# Patient Record
Sex: Male | Born: 1980 | Race: White | Hispanic: No | Smoking: Current every day smoker
Health system: Southern US, Community
[De-identification: ages and names within clinical notes are randomized; demographics above are authoritative.]

## PROBLEM LIST (undated history)

## (undated) DIAGNOSIS — Z720 Tobacco use: Secondary | ICD-10-CM

## (undated) DIAGNOSIS — R112 Nausea with vomiting, unspecified: Secondary | ICD-10-CM

## (undated) DIAGNOSIS — Z9889 Other specified postprocedural states: Secondary | ICD-10-CM

## (undated) DIAGNOSIS — T7840XA Allergy, unspecified, initial encounter: Secondary | ICD-10-CM

## (undated) DIAGNOSIS — IMO0001 Reserved for inherently not codable concepts without codable children: Secondary | ICD-10-CM

## (undated) DIAGNOSIS — K219 Gastro-esophageal reflux disease without esophagitis: Secondary | ICD-10-CM

## (undated) DIAGNOSIS — K604 Rectal fistula, unspecified: Secondary | ICD-10-CM

## (undated) HISTORY — DX: Tobacco use: Z72.0

## (undated) HISTORY — PX: MOUTH SURGERY: SHX715

## (undated) HISTORY — DX: Rectal fistula, unspecified: K60.40

## (undated) HISTORY — DX: Allergy, unspecified, initial encounter: T78.40XA

## (undated) HISTORY — DX: Rectal fistula: K60.4

## (undated) HISTORY — PX: KNEE ARTHROSCOPY: SUR90

---

## 2011-08-11 ENCOUNTER — Other Ambulatory Visit: Payer: Self-pay | Admitting: Neurological Surgery

## 2011-08-11 DIAGNOSIS — M545 Low back pain, unspecified: Secondary | ICD-10-CM

## 2011-08-12 ENCOUNTER — Inpatient Hospital Stay: Admission: RE | Admit: 2011-08-12 | Payer: Self-pay | Source: Ambulatory Visit

## 2011-08-16 ENCOUNTER — Ambulatory Visit
Admission: RE | Admit: 2011-08-16 | Discharge: 2011-08-16 | Disposition: A | Discharge status: Home / Self Care | Payer: BC Managed Care – PPO | Source: Ambulatory Visit | Attending: Neurological Surgery | Admitting: Neurological Surgery

## 2011-08-16 DIAGNOSIS — M545 Low back pain, unspecified: Secondary | ICD-10-CM

## 2014-09-15 ENCOUNTER — Ambulatory Visit: Payer: Self-pay | Admitting: Family

## 2015-09-21 ENCOUNTER — Emergency Department (HOSPITAL_BASED_OUTPATIENT_CLINIC_OR_DEPARTMENT_OTHER): Payer: BLUE CROSS/BLUE SHIELD

## 2015-09-21 ENCOUNTER — Encounter (HOSPITAL_BASED_OUTPATIENT_CLINIC_OR_DEPARTMENT_OTHER): Payer: Self-pay | Admitting: *Deleted

## 2015-09-21 ENCOUNTER — Observation Stay (HOSPITAL_BASED_OUTPATIENT_CLINIC_OR_DEPARTMENT_OTHER)
Admission: EM | Admit: 2015-09-21 | Discharge: 2015-09-23 | Disposition: A | Discharge status: Home / Self Care | Payer: BLUE CROSS/BLUE SHIELD | Attending: General Surgery | Admitting: General Surgery

## 2015-09-21 DIAGNOSIS — F17210 Nicotine dependence, cigarettes, uncomplicated: Secondary | ICD-10-CM | POA: Insufficient documentation

## 2015-09-21 DIAGNOSIS — K219 Gastro-esophageal reflux disease without esophagitis: Secondary | ICD-10-CM | POA: Diagnosis not present

## 2015-09-21 DIAGNOSIS — R509 Fever, unspecified: Secondary | ICD-10-CM | POA: Diagnosis present

## 2015-09-21 DIAGNOSIS — K611 Rectal abscess: Principal | ICD-10-CM | POA: Insufficient documentation

## 2015-09-21 DIAGNOSIS — L02215 Cutaneous abscess of perineum: Secondary | ICD-10-CM

## 2015-09-21 HISTORY — DX: Gastro-esophageal reflux disease without esophagitis: K21.9

## 2015-09-21 LAB — CBC
HCT: 41.2 % (ref 39.0–52.0)
Hemoglobin: 14.3 g/dL (ref 13.0–17.0)
MCH: 29.4 pg (ref 26.0–34.0)
MCHC: 34.7 g/dL (ref 30.0–36.0)
MCV: 84.6 fL (ref 78.0–100.0)
Platelets: 231 10*3/uL (ref 150–400)
RBC: 4.87 MIL/uL (ref 4.22–5.81)
RDW: 13.1 % (ref 11.5–15.5)
WBC: 12.5 10*3/uL — ABNORMAL HIGH (ref 4.0–10.5)

## 2015-09-21 LAB — COMPREHENSIVE METABOLIC PANEL
ALT: 24 U/L (ref 17–63)
AST: 18 U/L (ref 15–41)
Albumin: 4.4 g/dL (ref 3.5–5.0)
Alkaline Phosphatase: 62 U/L (ref 38–126)
Anion gap: 9 (ref 5–15)
BUN: 14 mg/dL (ref 6–20)
CO2: 26 mmol/L (ref 22–32)
Calcium: 9 mg/dL (ref 8.9–10.3)
Chloride: 104 mmol/L (ref 101–111)
Creatinine, Ser: 1.01 mg/dL (ref 0.61–1.24)
GFR calc Af Amer: >60 mL/min (ref >60)
GFR calc non Af Amer: >60 mL/min (ref >60)
Glucose, Bld: 154 mg/dL — ABNORMAL HIGH (ref 65–99)
Potassium: 3.6 mmol/L (ref 3.5–5.1)
Sodium: 139 mmol/L (ref 135–145)
Total Bilirubin: 0.9 mg/dL (ref 0.3–1.2)
Total Protein: 7.4 g/dL (ref 6.5–8.1)

## 2015-09-21 MED ORDER — ACETAMINOPHEN 500 MG PO TABS
1000.0000 mg | ORAL_TABLET | Freq: Once | ORAL | Status: AC
  Administered 2015-09-21: 1000 mg via ORAL

## 2015-09-21 MED ORDER — KETOROLAC TROMETHAMINE 30 MG/ML IJ SOLN
30.0000 mg | Freq: Once | INTRAMUSCULAR | Status: AC
Start: 2015-09-21 — End: 2015-09-21
  Administered 2015-09-21: 30 mg via INTRAVENOUS
  Filled 2015-09-21: qty 1

## 2015-09-21 MED ORDER — ACETAMINOPHEN 500 MG PO TABS
ORAL_TABLET | ORAL | Status: AC
  Filled 2015-09-21: qty 2

## 2015-09-21 MED ORDER — SODIUM CHLORIDE 0.9 % IV BOLUS (SEPSIS)
1000.0000 mL | Freq: Once | INTRAVENOUS | Status: AC
  Administered 2015-09-21: 1000 mL via INTRAVENOUS

## 2015-09-21 NOTE — ED Notes (Signed)
Pt c/o abscess noted above rectal area, also c/o fatigue and generalized weakness and  fever  X 1 week

## 2015-09-21 NOTE — ED Provider Notes (Signed)
CSN: [id number]     Arrival date & time 09/21/15  2215 History  By signing my name below, I, Freida Busman, attest that this documentation has been prepared under the direction and in the presence of Cyan Moultrie, MD . Electronically Signed: Freida Busman, Scribe. 09/21/2015. 11:45 PM.  Chief Complaint  Patient presents with  . Abscess  . Fever   Patient is a 35 y.o. male presenting with fever and constipation. The history is provided by the patient. No language interpreter was used.  Fever Temp source:  Oral Severity:  Moderate Onset quality:  Gradual Timing:  Intermittent Progression:  Unchanged Chronicity:  New Relieved by:  Nothing Worsened by:  Nothing tried Associated symptoms: diarrhea and nausea   Associated symptoms: no chest pain, no dysuria and no vomiting   Associated symptoms comment:  Bulging area by his bottom Risk factors: no hx of cancer and no immunosuppression   Constipation Severity:  Moderate Progression:  Resolved Chronicity:  New Relieved by:  Stool softeners Associated symptoms: diarrhea, fever and nausea   Associated symptoms: no dysuria and no vomiting      HPI Comments:  YUAN GANN is a 35 y.o. male who presents to the Emergency Department complaining of painful area to his rectum which he first noticed yesterday. He is unsure if it is hemorrhoid or abscess. He  notes associated constipation x ~ 5 days with associated nausea x 3 days and fever. He reports taking stool softeners and having multiple episodes of diarrhea 3 days ago. No alleviating factors noted for pain. He also notes he has small children at home.    Past Medical History  Diagnosis Date  . GERD (gastroesophageal reflux disease)    History reviewed. No pertinent past surgical history. History reviewed. No pertinent family history. Social History  Substance Use Topics  . Smoking status: Current Every Day Smoker -- 1.00 packs/day    Types: Cigarettes  . Smokeless tobacco: None  .  Alcohol Use: No    Review of Systems  Constitutional: Positive for fever.  Respiratory: Negative for shortness of breath.   Cardiovascular: Negative for chest pain.  Gastrointestinal: Positive for nausea, diarrhea and constipation. Negative for vomiting.  Genitourinary: Negative for dysuria.  All other systems reviewed and are negative.  Allergies  Review of patient's allergies indicates no known allergies.  Home Medications   Prior to Admission medications   Medication Sig Start Date End Date Taking? Authorizing Provider  ibuprofen (ADVIL,MOTRIN) 400 MG tablet Take 400 mg by mouth every 6 (six) hours as needed.   Yes Historical Provider, MD  omeprazole (PRILOSEC) 40 MG capsule Take 40 mg by mouth daily.   Yes Historical Provider, MD   BP 126/64 mmHg  Pulse 89  Temp(Src) 100.5 F (38.1 C)  Resp 16  Ht  (2.032 m)  Wt 215 lb (97.523 kg)  BMI 23.62 kg/m2  SpO2 98% Physical Exam  Constitutional: He is oriented to person, place, and time. He appears well-developed and well-nourished. No distress.  HENT:  Head: Normocephalic and atraumatic.  Mouth/Throat: Oropharynx is clear and moist. No oropharyngeal exudate.  Moist mucous membranes   Eyes: Conjunctivae are normal. Pupils are equal, round, and reactive to light.  Neck: No JVD present.  Trachea midline  Cardiovascular: Normal rate, regular rhythm and normal heart sounds.   Pulmonary/Chest: Effort normal and breath sounds normal. No respiratory distress. He has no wheezes. He has no rales.  Abdominal: Soft. Bowel sounds are normal. He exhibits no  distension and no mass. There is no tenderness. There is no rebound and no guarding.  Genitourinary:  induration and warmth in perineum; no definitive fluctuance.  No erythema Chaperone (scribe) was present for rectal exam which was performed with no discomfort or complications.   Musculoskeletal: Normal range of motion.  Lymphadenopathy:    He has no cervical adenopathy.   Neurological: He is alert and oriented to person, place, and time. He has normal reflexes.  Skin: Skin is warm and dry.  Psychiatric: He has a normal mood and affect. His behavior is normal.  Nursing note and vitals reviewed.   ED Course  Procedures  DIAGNOSTIC STUDIES:  Oxygen Saturation is 98% on RA, normal by my interpretation.    COORDINATION OF CARE:  11:42 PM Discussed treatment plan with pt at bedside and pt agreed to plan.  Labs Review Labs Reviewed  CBC - Abnormal; Notable for the following:    WBC 12.5 (*)    All other components within normal limits  COMPREHENSIVE METABOLIC PANEL - Abnormal; Notable for the following:    Glucose, Bld 154 (*)    All other components within normal limits  CULTURE, BLOOD (SINGLE)    Imaging Review No results found. I have personally reviewed and evaluated these images and lab results as part of my medical decision-making.   EKG Interpretation None      MDM   Final diagnoses:  None   Filed Vitals:   09/22/15 0230 09/22/15 0300  BP: 131/77 111/87  Pulse: 75 85  Temp: 98.1 F (36.7 C)   Resp: 16    Results for orders placed or performed during the hospital encounter of 09/21/15  CBC  Result Value Ref Range   WBC 12.5 (H) 4.0 - 10.5 K/uL   RBC 4.87 4.22 - 5.81 MIL/uL   Hemoglobin 14.3 13.0 - 17.0 g/dL   HCT 16.1 09.6 - 04.5 %   MCV 84.6 78.0 - 100.0 fL   MCH 29.4 26.0 - 34.0 pg   MCHC 34.7 30.0 - 36.0 g/dL   RDW 40.9 81.1 - 91.4 %   Platelets 231 150 - 400 K/uL  Comprehensive metabolic panel  Result Value Ref Range   Sodium 139 135 - 145 mmol/L   Potassium 3.6 3.5 - 5.1 mmol/L   Chloride 104 101 - 111 mmol/L   CO2 26 22 - 32 mmol/L   Glucose, Bld 154 (H) 65 - 99 mg/dL   BUN 14 6 - 20 mg/dL   Creatinine, Ser 7.82 0.61 - 1.24 mg/dL   Calcium 9.0 8.9 - 95.6 mg/dL   Total Protein 7.4 6.5 - 8.1 g/dL   Albumin 4.4 3.5 - 5.0 g/dL   AST 18 15 - 41 U/L   ALT 24 17 - 63 U/L   Alkaline Phosphatase 62 38 - 126 U/L    Total Bilirubin 0.9 0.3 - 1.2 mg/dL   GFR calc non Af Amer >60 >60 mL/min   GFR calc Af Amer >60 >60 mL/min   Anion gap 9 5 - 15   Ct Abdomen Pelvis W Contrast  09/22/2015  CLINICAL DATA:  Peroneal pain for 2 days. EXAM: CT ABDOMEN AND PELVIS WITH CONTRAST TECHNIQUE: Multidetector CT imaging of the abdomen and pelvis was performed using the standard protocol following bolus administration of intravenous contrast. CONTRAST:  ISOVUE-300 IOPAMIDOL (ISOVUE-300) INJECTION 61% COMPARISON:  None. FINDINGS: There is a peripherally enhancing perineal collection measuring 2.6 x 4.0 by 3.0 cm. This probably represents peroneal abscess.  There is no soft tissue gas. There is no extension into the pelvis or ischiorectal fossa. There are normal appearances of the liver, gallbladder, bile ducts, pancreas, spleen, adrenals and kidneys. The abdominal aorta is normal in caliber. There is no atherosclerotic calcification. There is no adenopathy in the abdomen or pelvis. There are normal appearances of the stomach, small bowel and colon. The appendix is normal. There is no significant abnormality in the lower chest. There is no significant skeletal lesion. Moderate degenerative lumbar disc disease is present with osteophytic encroachment on the left neural foramen at L5-S1. IMPRESSION: 1. 3 x 4 cm peripherally enhancing perineal collection consistent with abscess. 2. No other acute findings are evident. Electronically Signed   By: Ellery Plunkaniel R Mitchell M.D.   On: 09/22/2015 01:12    Medications  acetaminophen (TYLENOL) tablet 1,000 mg (1,000 mg Oral Given 09/21/15 2230)  sodium chloride 0.9 % bolus 1,000 mL (0 mLs Intravenous Stopped 09/22/15 0044)  ketorolac (TORADOL) 30 MG/ML injection 30 mg (30 mg Intravenous Given 09/21/15 2350)  iopamidol (ISOVUE-300) 61 % injection 100 mL (100 mLs Intravenous Contrast Given 09/22/15 0054)  vancomycin (VANCOCIN) IVPB 1000 mg/200 mL premix (0 mg Intravenous Stopped 09/22/15 0521)   piperacillin-tazobactam (ZOSYN) IVPB 3.375 g (0 g Intravenous Stopped 09/22/15 0308)  sodium chloride 0.9 % bolus 1,000 mL (0 mLs Intravenous Stopped 09/22/15 0522)  fentaNYL (SUBLIMAZE) injection 100 mcg (100 mcg Intravenous Given 09/22/15 0238)    Case d/w Dr. Daphine DeutscherMartin IV abx [facility] Case d/w Dr. Judd Lienelo who is aware  I personally performed the services described in this documentation, which was scribed in my presence. The recorded information has been reviewed and is accurate.      Cy BlamerApril Amanii Snethen, MD 09/22/15 [phone]

## 2015-09-22 ENCOUNTER — Encounter (HOSPITAL_COMMUNITY)
Admission: EM | Disposition: A | Discharge status: Home / Self Care | Payer: Self-pay | Source: Home / Self Care | Attending: Emergency Medicine

## 2015-09-22 ENCOUNTER — Encounter (HOSPITAL_BASED_OUTPATIENT_CLINIC_OR_DEPARTMENT_OTHER): Payer: Self-pay | Admitting: Emergency Medicine

## 2015-09-22 ENCOUNTER — Observation Stay (HOSPITAL_COMMUNITY): Payer: BLUE CROSS/BLUE SHIELD | Admitting: Certified Registered Nurse Anesthetist

## 2015-09-22 DIAGNOSIS — K611 Rectal abscess: Secondary | ICD-10-CM | POA: Diagnosis not present

## 2015-09-22 DIAGNOSIS — F17210 Nicotine dependence, cigarettes, uncomplicated: Secondary | ICD-10-CM | POA: Diagnosis not present

## 2015-09-22 DIAGNOSIS — K219 Gastro-esophageal reflux disease without esophagitis: Secondary | ICD-10-CM | POA: Diagnosis not present

## 2015-09-22 HISTORY — PX: IRRIGATION AND DEBRIDEMENT ABSCESS: SHX5252

## 2015-09-22 LAB — CREATININE, SERUM
Creatinine, Ser: 0.96 mg/dL (ref 0.61–1.24)
GFR calc Af Amer: >60 mL/min (ref >60)
GFR calc non Af Amer: >60 mL/min (ref >60)

## 2015-09-22 LAB — CBC
HCT: 39.5 % (ref 39.0–52.0)
Hemoglobin: 13.5 g/dL (ref 13.0–17.0)
MCH: 28.8 pg (ref 26.0–34.0)
MCHC: 34.2 g/dL (ref 30.0–36.0)
MCV: 84.2 fL (ref 78.0–100.0)
Platelets: 211 10*3/uL (ref 150–400)
RBC: 4.69 MIL/uL (ref 4.22–5.81)
RDW: 13.5 % (ref 11.5–15.5)
WBC: 12.4 10*3/uL — ABNORMAL HIGH (ref 4.0–10.5)

## 2015-09-22 LAB — SURGICAL PCR SCREEN
MRSA, PCR: NEGATIVE
Staphylococcus aureus: NEGATIVE

## 2015-09-22 LAB — GRAM STAIN

## 2015-09-22 SURGERY — IRRIGATION AND DEBRIDEMENT ABSCESS
Anesthesia: General | Site: Perineum

## 2015-09-22 MED ORDER — ONDANSETRON HCL 4 MG/2ML IJ SOLN
INTRAMUSCULAR | Status: DC | PRN
  Administered 2015-09-22: 4 mg via INTRAVENOUS

## 2015-09-22 MED ORDER — PIPERACILLIN-TAZOBACTAM 3.375 G IVPB 30 MIN
3.3750 g | Freq: Once | INTRAVENOUS | Status: AC
  Administered 2015-09-22: 3.375 g via INTRAVENOUS
  Filled 2015-09-22 (×2): qty 50

## 2015-09-22 MED ORDER — PIPERACILLIN-TAZOBACTAM 3.375 G IVPB
3.3750 g | Freq: Three times a day (TID) | INTRAVENOUS | Status: DC
  Administered 2015-09-22 – 2015-09-23 (×3): 3.375 g via INTRAVENOUS
  Filled 2015-09-22 (×5): qty 50

## 2015-09-22 MED ORDER — KCL IN DEXTROSE-NACL 20-5-0.45 MEQ/L-%-% IV SOLN
INTRAVENOUS | Status: DC
  Administered 2015-09-22: 10:00:00 via INTRAVENOUS
  Filled 2015-09-22 (×2): qty 1000

## 2015-09-22 MED ORDER — ONDANSETRON 4 MG PO TBDP: 4.0000 mg | ORAL_TABLET | Freq: Four times a day (QID) | ORAL | Status: DC | PRN

## 2015-09-22 MED ORDER — BUPIVACAINE LIPOSOME 1.3 % IJ SUSP
20.0000 mL | Freq: Once | INTRAMUSCULAR | Status: DC
  Filled 2015-09-22: qty 20

## 2015-09-22 MED ORDER — BUPIVACAINE HCL (PF) 0.5 % IJ SOLN
INTRAMUSCULAR | Status: DC | PRN
  Administered 2015-09-22: 10 mL

## 2015-09-22 MED ORDER — MIDAZOLAM HCL 5 MG/5ML IJ SOLN
INTRAMUSCULAR | Status: DC | PRN
  Administered 2015-09-22 (×2): 1 mg via INTRAVENOUS

## 2015-09-22 MED ORDER — MORPHINE SULFATE (PF) 2 MG/ML IV SOLN
1.0000 mg | INTRAVENOUS | Status: DC | PRN
  Administered 2015-09-22 (×2): 1 mg via INTRAVENOUS
  Filled 2015-09-22 (×2): qty 1

## 2015-09-22 MED ORDER — ROCURONIUM BROMIDE 100 MG/10ML IV SOLN
INTRAVENOUS | Status: DC | PRN
  Administered 2015-09-22: 40 mg via INTRAVENOUS

## 2015-09-22 MED ORDER — ONDANSETRON HCL 4 MG/2ML IJ SOLN: 4.0000 mg | Freq: Four times a day (QID) | INTRAMUSCULAR | Status: DC | PRN

## 2015-09-22 MED ORDER — ENOXAPARIN SODIUM 40 MG/0.4ML ~~LOC~~ SOLN
40.0000 mg | SUBCUTANEOUS | Status: DC
  Administered 2015-09-23: 40 mg via SUBCUTANEOUS
  Filled 2015-09-22 (×2): qty 0.4

## 2015-09-22 MED ORDER — LACTATED RINGERS IV SOLN: INTRAVENOUS | Status: DC

## 2015-09-22 MED ORDER — ONDANSETRON 4 MG PO TBDP: 4.0000 mg | ORAL_TABLET | ORAL | Status: DC | PRN

## 2015-09-22 MED ORDER — DEXAMETHASONE SODIUM PHOSPHATE 4 MG/ML IJ SOLN
INTRAMUSCULAR | Status: DC | PRN
  Administered 2015-09-22: 10 mg via INTRAVENOUS

## 2015-09-22 MED ORDER — BUPIVACAINE-EPINEPHRINE (PF) 0.5% -1:200000 IJ SOLN
INTRAMUSCULAR | Status: AC
  Filled 2015-09-22: qty 30

## 2015-09-22 MED ORDER — LACTATED RINGERS IV SOLN
INTRAVENOUS | Status: DC
  Administered 2015-09-22: 08:00:00 via INTRAVENOUS

## 2015-09-22 MED ORDER — MIDAZOLAM HCL 2 MG/2ML IJ SOLN
INTRAMUSCULAR | Status: AC
  Filled 2015-09-22: qty 2

## 2015-09-22 MED ORDER — FENTANYL CITRATE (PF) 250 MCG/5ML IJ SOLN
INTRAMUSCULAR | Status: AC
  Filled 2015-09-22: qty 5

## 2015-09-22 MED ORDER — SENNA 8.6 MG PO TABS
1.0000 | ORAL_TABLET | Freq: Two times a day (BID) | ORAL | Status: DC
  Administered 2015-09-22: 8.6 mg via ORAL

## 2015-09-22 MED ORDER — FENTANYL CITRATE (PF) 100 MCG/2ML IJ SOLN
INTRAMUSCULAR | Status: DC | PRN
  Administered 2015-09-22: 100 ug via INTRAVENOUS
  Administered 2015-09-22: 50 ug via INTRAVENOUS

## 2015-09-22 MED ORDER — LIDOCAINE HCL (CARDIAC) 20 MG/ML IV SOLN
INTRAVENOUS | Status: AC
  Filled 2015-09-22: qty 5

## 2015-09-22 MED ORDER — DEXAMETHASONE SODIUM PHOSPHATE 10 MG/ML IJ SOLN
INTRAMUSCULAR | Status: AC
  Filled 2015-09-22: qty 1

## 2015-09-22 MED ORDER — PROPOFOL 10 MG/ML IV BOLUS
INTRAVENOUS | Status: AC
  Filled 2015-09-22: qty 20

## 2015-09-22 MED ORDER — VANCOMYCIN HCL IN DEXTROSE 1-5 GM/200ML-% IV SOLN
1000.0000 mg | Freq: Once | INTRAVENOUS | Status: AC
  Administered 2015-09-22: 1000 mg via INTRAVENOUS
  Filled 2015-09-22: qty 200

## 2015-09-22 MED ORDER — FENTANYL CITRATE (PF) 100 MCG/2ML IJ SOLN
100.0000 ug | Freq: Once | INTRAMUSCULAR | Status: AC
  Administered 2015-09-22: 100 ug via INTRAVENOUS
  Filled 2015-09-22: qty 2

## 2015-09-22 MED ORDER — HEPARIN SODIUM (PORCINE) 5000 UNIT/ML IJ SOLN
5000.0000 [IU] | Freq: Three times a day (TID) | INTRAMUSCULAR | Status: DC
  Filled 2015-09-22 (×3): qty 1

## 2015-09-22 MED ORDER — SUGAMMADEX SODIUM 200 MG/2ML IV SOLN
INTRAVENOUS | Status: DC | PRN
  Administered 2015-09-22: 200 mg via INTRAVENOUS

## 2015-09-22 MED ORDER — SODIUM CHLORIDE 0.9 % IV BOLUS (SEPSIS)
1000.0000 mL | Freq: Once | INTRAVENOUS | Status: AC
  Administered 2015-09-22: 1000 mL via INTRAVENOUS

## 2015-09-22 MED ORDER — HYDROCODONE-ACETAMINOPHEN 5-325 MG PO TABS
1.0000 | ORAL_TABLET | ORAL | Status: DC | PRN
  Administered 2015-09-22 (×2): 1 via ORAL
  Administered 2015-09-23: 2 via ORAL
  Filled 2015-09-22: qty 2
  Filled 2015-09-22: qty 1
  Filled 2015-09-22: qty 2
  Filled 2015-09-22: qty 1

## 2015-09-22 MED ORDER — PANTOPRAZOLE SODIUM 40 MG IV SOLR
40.0000 mg | Freq: Every day | INTRAVENOUS | Status: DC
  Administered 2015-09-22: 40 mg via INTRAVENOUS
  Filled 2015-09-22 (×2): qty 40

## 2015-09-22 MED ORDER — IOPAMIDOL (ISOVUE-300) INJECTION 61%
100.0000 mL | Freq: Once | INTRAVENOUS | Status: AC | PRN
  Administered 2015-09-22: 100 mL via INTRAVENOUS

## 2015-09-22 MED ORDER — POTASSIUM CHLORIDE 2 MEQ/ML IV SOLN
INTRAVENOUS | Status: DC
  Administered 2015-09-22 – 2015-09-23 (×2): via INTRAVENOUS
  Filled 2015-09-22 (×5): qty 1000

## 2015-09-22 MED ORDER — SUGAMMADEX SODIUM 200 MG/2ML IV SOLN
INTRAVENOUS | Status: AC
  Filled 2015-09-22: qty 2

## 2015-09-22 MED ORDER — POLYETHYLENE GLYCOL 3350 17 G PO PACK: 17.0000 g | PACK | Freq: Every day | ORAL | Status: DC | PRN

## 2015-09-22 MED ORDER — MEPERIDINE HCL 50 MG/ML IJ SOLN: 6.2500 mg | INTRAMUSCULAR | Status: DC | PRN

## 2015-09-22 MED ORDER — LIDOCAINE HCL (CARDIAC) 20 MG/ML IV SOLN
INTRAVENOUS | Status: DC | PRN
  Administered 2015-09-22: 100 mg via INTRAVENOUS

## 2015-09-22 MED ORDER — METOCLOPRAMIDE HCL 5 MG/ML IJ SOLN
INTRAMUSCULAR | Status: AC
  Filled 2015-09-22: qty 2

## 2015-09-22 MED ORDER — PROPOFOL 10 MG/ML IV BOLUS
INTRAVENOUS | Status: DC | PRN
  Administered 2015-09-22: 200 mg via INTRAVENOUS

## 2015-09-22 MED ORDER — ONDANSETRON HCL 4 MG/2ML IJ SOLN
INTRAMUSCULAR | Status: AC
  Filled 2015-09-22: qty 2

## 2015-09-22 MED ORDER — ONDANSETRON HCL 4 MG/2ML IJ SOLN
4.0000 mg | Freq: Four times a day (QID) | INTRAMUSCULAR | Status: DC | PRN
  Administered 2015-09-22: 4 mg via INTRAVENOUS
  Filled 2015-09-22: qty 2

## 2015-09-22 MED ORDER — METOCLOPRAMIDE HCL 5 MG/ML IJ SOLN
INTRAMUSCULAR | Status: DC | PRN
  Administered 2015-09-22: 10 mg via INTRAVENOUS

## 2015-09-22 MED ORDER — ROCURONIUM BROMIDE 50 MG/5ML IV SOLN
INTRAVENOUS | Status: AC
  Filled 2015-09-22: qty 1

## 2015-09-22 MED ORDER — SCOPOLAMINE 1 MG/3DAYS TD PT72
1.0000 | MEDICATED_PATCH | TRANSDERMAL | Status: DC
  Administered 2015-09-22: 1.5 mg via TRANSDERMAL

## 2015-09-22 MED ORDER — METOCLOPRAMIDE HCL 5 MG/ML IJ SOLN: 10.0000 mg | Freq: Once | INTRAMUSCULAR | Status: DC | PRN

## 2015-09-22 MED ORDER — DIBUCAINE 1 % RE OINT
TOPICAL_OINTMENT | RECTAL | Status: AC
  Filled 2015-09-22: qty 28

## 2015-09-22 MED ORDER — FENTANYL CITRATE (PF) 100 MCG/2ML IJ SOLN: 25.0000 ug | INTRAMUSCULAR | Status: DC | PRN

## 2015-09-22 MED ORDER — HYDROMORPHONE HCL 1 MG/ML IJ SOLN
1.0000 mg | INTRAMUSCULAR | Status: DC | PRN
  Administered 2015-09-22 (×3): 1 mg via INTRAVENOUS
  Filled 2015-09-22 (×2): qty 1

## 2015-09-22 MED ORDER — HYDROMORPHONE HCL 1 MG/ML IJ SOLN
1.0000 mg | INTRAMUSCULAR | Status: DC | PRN
  Administered 2015-09-22: 1 mg via INTRAVENOUS
  Filled 2015-09-22 (×2): qty 1

## 2015-09-22 MED ORDER — LACTATED RINGERS IV SOLN
INTRAVENOUS | Status: DC
  Administered 2015-09-22: 1000 mL via INTRAVENOUS

## 2015-09-22 MED ORDER — ACETAMINOPHEN 500 MG PO TABS
1000.0000 mg | ORAL_TABLET | Freq: Four times a day (QID) | ORAL | Status: DC | PRN
  Administered 2015-09-22: 1000 mg via ORAL
  Filled 2015-09-22: qty 2

## 2015-09-22 MED ORDER — SCOPOLAMINE 1 MG/3DAYS TD PT72
MEDICATED_PATCH | TRANSDERMAL | Status: AC
  Filled 2015-09-22: qty 1

## 2015-09-22 MED ORDER — PIPERACILLIN-TAZOBACTAM 3.375 G IVPB: 3.3750 g | Freq: Three times a day (TID) | INTRAVENOUS | Status: DC

## 2015-09-22 SURGICAL SUPPLY — 26 items
BLADE HEX COATED 2.75 (ELECTRODE) ×2 IMPLANT
BLADE SURG 15 STRL LF DISP TIS (BLADE) ×1 IMPLANT
BLADE SURG 15 STRL SS (BLADE) ×1
COVER SURGICAL LIGHT HANDLE (MISCELLANEOUS) IMPLANT
ELECT PENCIL ROCKER SW 15FT (MISCELLANEOUS) ×2 IMPLANT
ELECT REM PT RETURN 9FT ADLT (ELECTROSURGICAL) ×2
ELECTRODE REM PT RTRN 9FT ADLT (ELECTROSURGICAL) ×1 IMPLANT
GAUZE IODOFORM PACK 1/2 7832 (GAUZE/BANDAGES/DRESSINGS) ×2 IMPLANT
GAUZE SPONGE 4X4 12PLY STRL (GAUZE/BANDAGES/DRESSINGS) ×2 IMPLANT
GAUZE SPONGE 4X4 16PLY XRAY LF (GAUZE/BANDAGES/DRESSINGS) ×2 IMPLANT
GLOVE BIOGEL PI IND STRL 7.0 (GLOVE) ×1 IMPLANT
GLOVE BIOGEL PI INDICATOR 7.0 (GLOVE) ×1
GLOVE EUDERMIC 7 POWDERFREE (GLOVE) ×2 IMPLANT
GOWN STRL REUS W/TWL LRG LVL3 (GOWN DISPOSABLE) ×2 IMPLANT
GOWN STRL REUS W/TWL XL LVL3 (GOWN DISPOSABLE) ×4 IMPLANT
LUBRICANT JELLY K Y 4OZ (MISCELLANEOUS) ×2 IMPLANT
NEEDLE HYPO 22GX1.5 SAFETY (NEEDLE) ×2 IMPLANT
PACK LITHOTOMY IV (CUSTOM PROCEDURE TRAY) ×2 IMPLANT
PAD ABD 8X10 STRL (GAUZE/BANDAGES/DRESSINGS) ×2 IMPLANT
SOL PREP PROV IODINE SCRUB 4OZ (MISCELLANEOUS) ×2 IMPLANT
SWAB COLLECTION DEVICE MRSA (MISCELLANEOUS) ×2 IMPLANT
SYR BULB IRRIGATION 50ML (SYRINGE) ×2 IMPLANT
SYR CONTROL 10ML LL (SYRINGE) ×2 IMPLANT
TOWEL OR 17X26 10 PK STRL BLUE (TOWEL DISPOSABLE) ×2 IMPLANT
UNDERPAD 30X30 INCONTINENT (UNDERPADS AND DIAPERS) ×2 IMPLANT
YANKAUER SUCT BULB TIP 10FT TU (MISCELLANEOUS) ×2 IMPLANT

## 2015-09-22 NOTE — Progress Notes (Signed)
Pharmacy Antibiotic Note  Roberto CannyDarren R Mason is a 35 y.o. male admitted on 09/21/2015 with anterior perirectal abscess. Plan for trip to OR for I/D. Pharmacy has been consulted for Zosyn dosing.  Plan: Zosyn 3.375g IV q8h (infuse over 4 hours) F/u renal fxn, cultures, clinical course  Dosage remains stable and need for further dosage adjustment appears unlikely at present.    Will sign off at this time.  Please reconsult if a change in clinical status warrants re-evaluation of dosage.  Height: 6\' 8"  (203.2 cm) Weight: 215 lb (97.523 kg) IBW/kg (Calculated) : 96  Temp (24hrs), Avg:99.1 F (37.3 C), Min:98.1 F (36.7 C), Max:100.5 F (38.1 C)   Recent Labs Lab 09/21/15 2228  WBC 12.5*  CREATININE 1.01    Estimated Creatinine Clearance: 136 mL/min (by C-G formula based on Cr of 1.01).    No Known Allergies  Antimicrobials this admission: 5/3 Vancomycin x1 5/3 Zosyn >>   Dose adjustments this admission:  Microbiology results: 5/2 BCx x1: IP  Thank you for allowing pharmacy to be a part of this patient's care.  Roberto Mason, PharmD, BCPS 09/22/2015, 7:19 AM  Pager: [phone]

## 2015-09-22 NOTE — H&P (Signed)
Chief Complaint:  Transfer from [place] with perirectal abscess  History of Present Illness:  Roberto Mason is an 35 y.o. male from [place] who presented to [place] with fever and pain in his perineum.  CT showed an anterior perirectal abscess.  Referred for evaluation and management.  Past Medical History  Diagnosis Date  . GERD (gastroesophageal reflux disease)     History reviewed. No pertinent past surgical history.  No current facility-administered medications for this encounter.   Current Outpatient Prescriptions  Medication Sig Dispense Refill  . ibuprofen (ADVIL,MOTRIN) 400 MG tablet Take 400 mg by mouth every 6 (six) hours as needed.    Marland Kitchen omeprazole (PRILOSEC) 40 MG capsule Take 40 mg by mouth daily.     Review of patient's allergies indicates no known allergies. History reviewed. No pertinent family history. Social History:   reports that he has been smoking Cigarettes.  He has been smoking about 1.00 pack per day. He does not have any smokeless tobacco history on file. He reports that he does not drink alcohol or use illicit drugs.   REVIEW OF SYSTEMS : Negative except for see above  Physical Exam:   Blood pressure 118/87, pulse 85, temperature 98.6 F (37 C), temperature source Oral, resp. rate 16, height '6\' 8"'$  (2.032 m), weight 97.523 kg (215 lb), SpO2 97 %. Body mass index is 23.62 kg/(m^2).  Gen:  WDWN WM NAD  Neurological: Alert and oriented to person, place, and time. Motor and sensory function is grossly intact  Head: Normocephalic and atraumatic.  Eyes: Conjunctivae are normal. Pupils are equal, round, and reactive to light. No scleral icterus.  Neck: Normal range of motion. Neck supple. No tracheal deviation or thyromegaly present.  Abdomen:  nontender GU:  Left anterior (2 oclock in dorsal lithotomy) palpable abscess with fluctuance Musculoskeletal: Normal range of motion. Extremities are nontender. No cyanosis, edema or  clubbing noted Lymphadenopathy: No cervical, preauricular, postauricular or axillary adenopathy is present Skin: Skin is warm and dry. No rash noted. No diaphoresis. No erythema. No pallor. Pscyh: Normal mood and affect. Behavior is normal. Judgment and thought content normal.   LABORATORY RESULTS: Results for orders placed or performed during the hospital encounter of 09/21/15 (from the past 48 hour(s))  CBC     Status: Abnormal   Collection Time: 09/21/15 10:28 PM  Result Value Ref Range   WBC 12.5 (H) 4.0 - 10.5 K/uL   RBC 4.87 4.22 - 5.81 MIL/uL   Hemoglobin 14.3 13.0 - 17.0 g/dL   HCT 41.2 39.0 - 52.0 %   MCV 84.6 78.0 - 100.0 fL   MCH 29.4 26.0 - 34.0 pg   MCHC 34.7 30.0 - 36.0 g/dL   RDW 13.1 11.5 - 15.5 %   Platelets 231 150 - 400 K/uL  Comprehensive metabolic panel     Status: Abnormal   Collection Time: 09/21/15 10:28 PM  Result Value Ref Range   Sodium 139 135 - 145 mmol/L   Potassium 3.6 3.5 - 5.1 mmol/L   Chloride 104 101 - 111 mmol/L   CO2 26 22 - 32 mmol/L   Glucose, Bld 154 (H) 65 - 99 mg/dL   BUN 14 6 - 20 mg/dL   Creatinine, Ser 1.01 0.61 - 1.24 mg/dL   Calcium 9.0 8.9 - 10.3 mg/dL   Total Protein 7.4 6.5 - 8.1 g/dL   Albumin 4.4 3.5 - 5.0 g/dL   AST 18 15 - 41 U/L  ALT 24 17 - 63 U/L   Alkaline Phosphatase 62 38 - 126 U/L   Total Bilirubin 0.9 0.3 - 1.2 mg/dL   GFR calc non Af Amer >60 >60 mL/min   GFR calc Af Amer >60 >60 mL/min    Comment: (NOTE) The eGFR has been calculated using the CKD EPI equation. This calculation has not been validated in all clinical situations. eGFR's persistently <60 mL/min signify possible Chronic Kidney Disease.    Anion gap 9 5 - 15     RADIOLOGY RESULTS: Ct Abdomen Pelvis W Contrast  09/22/2015  CLINICAL DATA:  Peroneal pain for 2 days. EXAM: CT ABDOMEN AND PELVIS WITH CONTRAST TECHNIQUE: Multidetector CT imaging of the abdomen and pelvis was performed using the standard protocol following bolus administration of  intravenous contrast. CONTRAST:  168m ISOVUE-300 IOPAMIDOL (ISOVUE-300) INJECTION 61% COMPARISON:  None. FINDINGS: There is a peripherally enhancing perineal collection measuring 2.6 x 4.0 by 3.0 cm. This probably represents peroneal abscess. There is no soft tissue gas. There is no extension into the pelvis or ischiorectal fossa. There are normal appearances of the liver, gallbladder, bile ducts, pancreas, spleen, adrenals and kidneys. The abdominal aorta is normal in caliber. There is no atherosclerotic calcification. There is no adenopathy in the abdomen or pelvis. There are normal appearances of the stomach, small bowel and colon. The appendix is normal. There is no significant abnormality in the lower chest. There is no significant skeletal lesion. Moderate degenerative lumbar disc disease is present with osteophytic encroachment on the left neural foramen at L5-S1. IMPRESSION: 1. 3 x 4 cm peripherally enhancing perineal collection consistent with abscess. 2. No other acute findings are evident. Electronically Signed   By: DAndreas NewportM.D.   On: 09/22/2015 01:12    Problem List: Patient Active Problem List   Diagnosis Date Noted  . Perirectal abscess 09/22/2015    Assessment & Plan: Perirectal abscess To OR for I & D of perirectal abscess    Matt B. MHassell Done MD, FSlidell Memorial Hospital[facility], P.[facility] [phone] 09/22/2015 7:03 AM

## 2015-09-22 NOTE — Progress Notes (Signed)
Gen. Surgery:  I have interviewed and examined this patient this morning Lab work and CT scan reviewed Discussed case with Dr. Daphine DeutscherMartin who performed history and physical Have initiated IV fluids and IV Zosyn  He has a perirectal abscess in the anterior position.  4 cm estimated size No prior GI disorders  I plan to take him to the operating room today for examination under anesthesia, anoscopy and proctoscopy, incision and drainage of perirectal abscess. I discussed the indications, details, techniques, and numerous risk of the surgery with the patient and his wife.  He is aware the risk of bleeding, recurrent infection, infection more extensive than described, sphincter damage with temporary or permanent incontinence, fistula formation, association with inflammatory bowel disease, and other unforeseen problems.  He understands these issues well.  At this time all his questions are answered.  He agrees with this plan.   Angelia MouldHaywood M. Derrell LollingIngram, M.D., [facility] InstituteFACS [facility] General and Minimally invasive Surgery Breast and Colorectal Surgery Office:   [phone] Pager:   [phone]

## 2015-09-22 NOTE — Transfer of Care (Signed)
Immediate Anesthesia Transfer of Care Note  Patient: Roberto CannyDarren R Craker  Procedure(s) Performed: Procedure(s): anascope IRRIGATION AND DEBRIDEMENT ABSCESS (N/A)  Patient Location: PACU  Anesthesia Type:General  Level of Consciousness: Patient easily awoken, sedated, comfortable, cooperative, following commands, responds to stimulation.   Airway & Oxygen Therapy: Patient spontaneously breathing, ventilating well, oxygen via simple oxygen mask.  Post-op Assessment: Report given to PACU RN, vital signs reviewed and stable, moving all extremities.   Post vital signs: Reviewed and stable.  Complications: No apparent anesthesia complications

## 2015-09-22 NOTE — Op Note (Signed)
Patient Name:           Roberto CannyDarren R Frimpong   Date of Surgery:        09/22/2015  Pre op Diagnosis:      Perirectal abscess  Post op Diagnosis:    Perirectal abscess, left anterior  Procedure:                 Diagnostic anoscopy                                      Incision and drainage perianal abscess                                      Obtain anaerobic and aerobic cultures  Surgeon:                     Angelia MouldHaywood M. Derrell LollingIngram, M.D., FACS  Assistant:                      OR staff  Operative Indications:   This [place] 35 [place] who presented with fever and pain in his perineum.  This has been going on for just a few days.  No prior history.  No prior gastrointestinal disorders.  Examination reveals a 4-5 cm abscess in the left anterior position, really about at 1:00 position.  This does not appear to involve the scrotum or the anal sphincters.  Operative Findings:       Anoscopy was normal.  The distal 10 inches of rectal mucosa were perfectly healthy and without inflammatory change.  There is no internal drainage.  There is no mass bulging into the lumen.  Anal sphincters had normal tone and were easily dilated.  Minimal hemorrhoids.  No fissure or fistula seen There was a large abscess at about the 12:30 position starting just below the base of the scrotum and going down to the skin outside the anoderm.  This appeared to be a unilocular abscess.  Purulent material was brownish tan.  Cultures were taken  Procedure in Detail:          Following the induction of general endotracheal anesthesia the patient was placed in lithotomy position in rigid padded stirrups.  Perianal area and scrotum were prepped and draped in a sterile fashion.  Surgical timeout was performed.  The patient had already received broad-spectrum antibiotics.  0.5% Marcaine with epinephrine was used as local infiltration anesthetic.    Lubricating jelly was used and digital rectal exam was normal  without any abnormal palpable findings or mass.  I slowly dilated the anal sphincters and inserted an ano- scope and carefully examined circumferentially up to about 10 cm and saw no mucosal abnormality or inflammation.  The endoscope was removed.     A syringe and large needle was used to aspirate some infected fluid from the abscess and that was sent for culture.  A radially oriented incision was made at about the 12:30 position and we entered the abscess cavity and evacuated all  fluid.  I made sure that all loculations were broken up and the cavity was irrigated well.  The abscess cavity did not extend into any unusual space.  Hemostasis was good.  The wound was packed with iodoform gauze and covered with 4 x 4  gauze and ABD pads and fishnet panties.  The patient tolerated the procedure well was taken to PACU in stable condition.  EBL 20 mL.  Counts correct.  Complications none.     Angelia Mould. Derrell Lolling, M.D., FACS General and Minimally Invasive Surgery Breast and Colorectal Surgery  09/22/2015 2:26 PM

## 2015-09-22 NOTE — ED Notes (Signed)
20 minutes starting until pt transfer

## 2015-09-22 NOTE — ED Notes (Signed)
Bed: WA01 Expected date:  Expected time:  Means of arrival:  [facility]

## 2015-09-22 NOTE — ED Notes (Signed)
Pt comes to Ed via care link via [facility], pt presents with a peri-rectal abscess/ which started 2 days ago. Care link reports abscess is deep tissue related and pt may go to surgery tonight.  Pt was given iv  Zofran and antibiotics in route before care link pick up.  Pt is alert and pain score is a 3. Pt reports nausea.

## 2015-09-22 NOTE — Anesthesia Preprocedure Evaluation (Signed)
Anesthesia Evaluation  Patient identified by MRN, date of birth, ID band Patient awake    Reviewed: Allergy & Precautions, NPO status , Patient's Chart, lab work & pertinent test results  Airway Mallampati: II  TM Distance: >3 FB Neck ROM: Full    Dental no notable dental hx.    Pulmonary Current Smoker,    Pulmonary exam normal breath sounds clear to auscultation       Cardiovascular negative cardio ROS Normal cardiovascular exam Rhythm:Regular Rate:Normal     Neuro/Psych negative neurological ROS  negative psych ROS   GI/Hepatic negative GI ROS, Neg liver ROS,   Endo/Other  negative endocrine ROS  Renal/GU negative Renal ROS  negative genitourinary   Musculoskeletal negative musculoskeletal ROS (+)   Abdominal   Peds negative pediatric ROS (+)  Hematology negative hematology ROS (+)   Anesthesia Other Findings   Reproductive/Obstetrics negative OB ROS                            Anesthesia Physical Anesthesia Plan  ASA: II  Anesthesia Plan: General   Post-op Pain Management:    Induction: Intravenous  Airway Management Planned: Oral ETT  Additional Equipment:   Intra-op Plan:   Post-operative Plan: Extubation in OR  Informed Consent: I have reviewed the patients History and Physical, chart, labs and discussed the procedure including the risks, benefits and alternatives for the proposed anesthesia with the patient or authorized representative who has indicated his/her understanding and acceptance.   Dental advisory given  Plan Discussed with: CRNA  Anesthesia Plan Comments:         Anesthesia Quick Evaluation  

## 2015-09-22 NOTE — Anesthesia Postprocedure Evaluation (Addendum)
Anesthesia Post Note  Patient: Roberto Mason  Procedure(s) Performed: Procedure(s) (LRB): anoscope IRRIGATION AND DEBRIDEMENT ABSCESS (N/A)  Patient location during evaluation: PACU Anesthesia Type: General Level of consciousness: oriented and awake and alert Pain management: pain level controlled Vital Signs Assessment: post-procedure vital signs reviewed and stable Respiratory status: spontaneous breathing, respiratory function stable and patient connected to nasal cannula oxygen Cardiovascular status: blood pressure returned to baseline and stable Postop Assessment: no headache and no backache Anesthetic complications: no    Last Vitals:  Filed Vitals:   09/22/15 1530 09/22/15 1639  BP: 121/69 125/68  Pulse: 89 75  Temp: 37.4 C 36.8 C  Resp:  15    Last Pain:  Filed Vitals:   09/22/15 1639  PainSc: 0-No pain                 Phillips Groutarignan, Christionna Poland

## 2015-09-22 NOTE — ED Notes (Signed)
Patient transported to CT 

## 2015-09-22 NOTE — Progress Notes (Signed)
Dr Derrell LollingIngram made aware patient has temp of 102.8.  Order received

## 2015-09-22 NOTE — Anesthesia Procedure Notes (Signed)
Procedure Name: Intubation Date/Time: 09/22/2015 1:57 PM Performed by: Ludwig LeanJONES, Shyhiem Beeney C Pre-anesthesia Checklist: Patient identified, Emergency Drugs available, Suction available, Patient being monitored and Timeout performed Patient Re-evaluated:Patient Re-evaluated prior to inductionOxygen Delivery Method: Circle system utilized Preoxygenation: Pre-oxygenation with 100% oxygen Intubation Type: IV induction Ventilation: Mask ventilation without difficulty and Oral airway inserted - appropriate to patient size Laryngoscope Size: Mac and 4 Grade View: Grade II Tube type: Oral Tube size: 7.5 mm Number of attempts: 1 Airway Equipment and Method: Patient positioned with wedge pillow and Stylet Placement Confirmation: ETT inserted through vocal cords under direct vision,  positive ETCO2,  CO2 detector and breath sounds checked- equal and bilateral Secured at: 23 cm Tube secured with: Tape Dental Injury: Teeth and Oropharynx as per pre-operative assessment

## 2015-09-23 MED ORDER — AMOXICILLIN-POT CLAVULANATE 875-125 MG PO TABS
1.0000 | ORAL_TABLET | Freq: Once | ORAL | Status: AC
  Administered 2015-09-23: 1 via ORAL
  Filled 2015-09-23: qty 1

## 2015-09-23 MED ORDER — AMOXICILLIN-POT CLAVULANATE 875-125 MG PO TABS: 1.0000 | ORAL_TABLET | Freq: Two times a day (BID) | ORAL | Status: DC

## 2015-09-23 MED ORDER — HYDROCODONE-ACETAMINOPHEN 5-325 MG PO TABS: 1.0000 | ORAL_TABLET | Freq: Four times a day (QID) | ORAL | Status: DC | PRN

## 2015-09-23 MED ORDER — SENNOSIDES-DOCUSATE SODIUM 8.6-50 MG PO TABS: 2.0000 | ORAL_TABLET | Freq: Two times a day (BID) | ORAL | Status: DC

## 2015-09-23 NOTE — Discharge Summary (Signed)
[facility] Discharge Summary   Patient ID: Roberto Mason MRN: [id number] DOB/AGE: 37/09/1980 37 y.o.  Admit date: 09/21/2015 Discharge date: 09/23/2015  Admitting Diagnosis: Perirectal abscess  Discharge Diagnosis Patient Active Problem List   Diagnosis Date Noted  . Perirectal abscess 09/22/2015    Consultants None  Imaging: Ct Abdomen Pelvis W Contrast  09/22/2015  CLINICAL DATA:  Peroneal pain for 2 days. EXAM: CT ABDOMEN AND PELVIS WITH CONTRAST TECHNIQUE: Multidetector CT imaging of the abdomen and pelvis was performed using the standard protocol following bolus administration of intravenous contrast. CONTRAST:  ISOVUE-300 IOPAMIDOL (ISOVUE-300) INJECTION 61% COMPARISON:  None. FINDINGS: There is a peripherally enhancing perineal collection measuring 2.6 x 4.0 by 3.0 cm. This probably represents peroneal abscess. There is no soft tissue gas. There is no extension into the pelvis or ischiorectal fossa. There are normal appearances of the liver, gallbladder, bile ducts, pancreas, spleen, adrenals and kidneys. The abdominal aorta is normal in caliber. There is no atherosclerotic calcification. There is no adenopathy in the abdomen or pelvis. There are normal appearances of the stomach, small bowel and colon. The appendix is normal. There is no significant abnormality in the lower chest. There is no significant skeletal lesion. Moderate degenerative lumbar disc disease is present with osteophytic encroachment on the left neural foramen at L5-S1. IMPRESSION: 1. 3 x 4 cm peripherally enhancing perineal collection consistent with abscess. 2. No other acute findings are evident. Electronically Signed   By: Ellery Plunk M.D.   On: 09/22/2015 01:12    Procedures Dr. Derrell Lolling (09/22/15) - Diagnostic anoscopy, I&D of a peri-anal abscess, cultures  Hospital Course:  35 y.o. male from [place] who presented to [facility] with fever, nausea, constipation, and gradual  onset of pain in his perineum which started 2 days prior to admission. He tried stool softeners without relief.  CT showed an anterior perirectal abscess.   Patient was admitted and underwent procedure listed above.  Tolerated procedure well and was transferred to the floor.  Diet was advanced as tolerated.  On POD #1, the packing removed and the patient started sitz baths.  He was voiding well, tolerating diet, ambulating well, pain well controlled, vital signs stable, wound clean and felt stable for discharge home.  Patient will follow up in our office in 2-3 weeks and knows to call with questions or concerns.  He will call to confirm appointment date/time.       Medication List    TAKE these medications        amoxicillin-clavulanate 875-125 MG tablet  Commonly known as:  AUGMENTIN  Take 1 tablet by mouth 2 (two) times daily.     HYDROcodone-acetaminophen 5-325 MG tablet  Commonly known as:  NORCO/VICODIN  Take 1-2 tablets by mouth every 6 (six) hours as needed for moderate pain.     ibuprofen 400 MG tablet  Commonly known as:  ADVIL,MOTRIN  Take 400 mg by mouth every 6 (six) hours as needed.     omeprazole 40 MG capsule  Commonly known as:  PRILOSEC  Take 40 mg by mouth daily.         Follow-up Information    Follow up with Ernestene Mention, MD. Call in 3 weeks.   Specialty:  General Surgery   Why:  For post-operation check, call to confirm your appointment date/time.   Contact information:   [address] [place] [address] [phone]       Signed: Rueben Bash.  Grace BlightBaird, PA-C [facility] [phone]  09/23/2015, 8:06 AM

## 2015-09-23 NOTE — Progress Notes (Signed)
Discharge instructions discussed with patient and family, verbalized agreement and understanding, prescriptions given to patient, discharged with family via private auto

## 2015-09-23 NOTE — Discharge Instructions (Signed)
Perirectal Abscess An abscess is an infected area that contains a collection of pus. A perirectal abscess is an abscess that is near the opening of the anus or around the rectum. A perirectal abscess can cause a lot of pain, especially during bowel movements. CAUSES This condition is almost always caused by an infection that starts in an anal gland. RISK FACTORS This condition is more likely to develop in:  People with diabetes or inflammatory bowel disease.  People whose body defense system (immune system) is weak.  People who have anal sex.  People who have a sexually transmitted disease (STD).  People who have certain kinds of cancers, such as rectal carcinoma, leukemia, or lymphoma. SYMPTOMS The main symptom of this condition is pain. The pain may be a throbbing pain that gets worse during bowel movements. Other symptoms include: 1. Fever. 2. Swelling. 3. Redness. 4. Bleeding. 5. Constipation. DIAGNOSIS The condition is diagnosed with a physical exam. If the abscess is not visible, a health care provider may need to place a finger inside the rectum to find the abscess. Sometimes, imaging tests are done to determine the size and location of the abscess. These tests may include:  An ultrasound.  An MRI.  A CT scan. TREATMENT This condition is usually treated with incision and drainage surgery. Incision and drainage surgery involves making an incision over the abscess to drain the pus. Treatment may also involve antibiotic medicine, pain medicine, stool softeners, or laxatives. HOME CARE INSTRUCTIONS  Take medicines only as directed by your health care provider.  If you were prescribed an antibiotic, finish all of it even if you start to feel better.  To relieve pain, try sitting:  In a warm, shallow bath (sitz bath).  On a heating pad with the setting on low.  On an inflatable donut-shaped cushion.  Follow any diet instructions as directed by your health care  provider.  Keep all follow-up visits as directed by your health care provider. This is important. SEEK MEDICAL CARE IF:  Your abscess is bleeding.  You have pain, swelling, or redness that is getting worse.  You are constipated.  You feel ill.  You have muscle aches or chills.  You have a fever.  Your symptoms return after the abscess has healed.   This information is not intended to replace advice given to you by your health care provider. Make sure you discuss any questions you have with your health care provider.   Document Released: 05/05/2000 Document Revised: 01/27/2015 Document Reviewed: 03/18/2014 Elsevier Interactive Patient Education 2016 [employer]  How to Take a Herbalistitz Bath (3-4x/day) A sitz bath is a warm water bath with epsom salts that is taken while you are sitting down. The water should only come up to your hips and should cover your buttocks. Your health care provider may recommend a sitz bath to help you:   Clean the lower part of your body, including your genital area.  With itching.  With pain.  With sore muscles or muscles that tighten or spasm. HOW TO TAKE A SITZ BATH Take 3-4 sitz baths per day or as told by your health care provider. 6. Partially fill a bathtub with warm water. You will only need the water to be deep enough to cover your hips and buttocks when you are sitting in it.  Add epsom salts (this can be bought at the drug store). 7. If your health care provider told you to put medicine in the water, follow the  directions exactly. 8. Sit in the water and open the tub drain a little. 9. Turn on the warm water again to keep the tub at the correct level. Keep the water running constantly. 10. Soak in the water for 15-20 minutes or as told by your health care provider. 11. After the sitz bath, pat the affected area dry first. Do not rub it. 12. Be careful when you stand up after the sitz bath because you may feel dizzy. SEEK MEDICAL CARE  IF:  Your symptoms get worse. Do not continue with sitz baths if your symptoms get worse.  You have new symptoms. Do not continue with sitz baths until you talk with your health care provider.   This information is not intended to replace advice given to you by your health care provider. Make sure you discuss any questions you have with your health care provider.   Document Released: 01/29/2004 Document Revised: 09/22/2014 Document Reviewed: 05/06/2014 Elsevier Interactive Patient Education [employer]

## 2015-09-23 NOTE — Progress Notes (Signed)
1 Day Post-Op  Subjective: Alert and stable.  No fevers.  Voiding without difficulty.  Ambulate to bathroom.  No bleeding. Asking if he can go home Operative findings discussed with patient Gram stain shows gram-negative rods  Objective: Vital signs in last 24 hours: Temp:  [97.7 F (36.5 C)-102.8 F (39.3 C)] 97.7 F (36.5 C) (05/03 2143) Pulse Rate:  [63-92] 63 (05/03 2143) Resp:  [15-20] 18 (05/03 2143) BP: (115-144)/(61-88) 128/70 mmHg (05/03 2143) SpO2:  [92 %-98 %] 96 % (05/03 2143) Last BM Date: 09/22/15  Intake/Output from previous day: 05/03 0701 - 05/04 0700 In: 3240 [P.O.:720; I.V.:2420; IV Piggyback:100] Out: 1 [Blood:1] Intake/Output this shift: Total I/O In: 2290 [P.O.:720; I.V.:1470; IV Piggyback:100] Out: -   General appearance: Alert.  Cooperative.  No distress Male genitalia: normal, Perianal bandage clean and dry.  Penis and scrotum normal.  No active bleeding.  Lab Results:  Results for orders placed or performed during the hospital encounter of 09/21/15 (from the past 24 hour(s))  CBC     Status: Abnormal   Collection Time: 09/22/15  8:50 AM  Result Value Ref Range   WBC 12.4 (H) 4.0 - 10.5 K/uL   RBC 4.69 4.22 - 5.81 MIL/uL   Hemoglobin 13.5 13.0 - 17.0 g/dL   HCT 16.139.5 09.639.0 - 04.552.0 %   MCV 84.2 78.0 - 100.0 fL   MCH 28.8 26.0 - 34.0 pg   MCHC 34.2 30.0 - 36.0 g/dL   RDW 40.913.5 81.111.5 - 91.415.5 %   Platelets 211 150 - 400 K/uL  Creatinine, serum     Status: None   Collection Time: 09/22/15  8:50 AM  Result Value Ref Range   Creatinine, Ser 0.96 0.61 - 1.24 mg/dL   GFR calc non Af Amer >60 >60 mL/min   GFR calc Af Amer >60 >60 mL/min  Surgical pcr screen     Status: None   Collection Time: 09/22/15  8:54 AM  Result Value Ref Range   MRSA, PCR NEGATIVE NEGATIVE   Staphylococcus aureus NEGATIVE NEGATIVE  Gram stain     Status: None   Collection Time: 09/22/15  2:21 PM  Result Value Ref Range   Specimen Description ABSCESS PERIRECTAL    Special  Requests NONE    Gram Stain      ABUNDANT WBC PRESENT, PREDOMINANTLY PMN ABUNDANT GRAM NEGATIVE RODS Gram Stain Report Called to,Read Back By and Verified With: DR Derrell LollingINGRAM [phone] @ 1552 BY J SCOTTON    Report Status 09/22/2015 FINAL      Studies/Results: No results found.  . enoxaparin (LOVENOX) injection  40 mg Subcutaneous Q24H  . pantoprazole (PROTONIX) IV  40 mg Intravenous QHS  . piperacillin-tazobactam (ZOSYN)  IV  3.375 g Intravenous Q8H  . senna  1 tablet Oral BID  . senna-docusate  2 tablet Oral BID     Assessment/Plan: s/p Procedure(s): anoscope IRRIGATION AND DEBRIDEMENT ABSCESS   POD #1.  Incision and drainage perianal and perineal abscess Stable and doing well Begin tub baths this morning and remove packing Nursing staff instructed not to repack Possible discharge this afternoon if no problems with pain or bleeding Will need outpatient antibiotics for a week  @PROBHOSP @     Roberto Mason 09/23/2015

## 2015-09-25 LAB — CULTURE, ROUTINE-ABSCESS

## 2015-09-27 LAB — CULTURE, BLOOD (SINGLE): Culture: NO GROWTH

## 2015-09-27 LAB — ANAEROBIC CULTURE

## 2016-01-03 ENCOUNTER — Ambulatory Visit: Payer: Self-pay | Admitting: Surgery

## 2016-01-03 NOTE — H&P (Addendum)
Roberto Mason 01/03/2016 2:02 PM [facility] Patient #: [id number] DOB: [birth year] Married / Language: English / Race: White Male    History of Present Illness Ardeth Sportsman MD; 01/03/2016 2:30 PM) The patient is a 35 year old male who presents with a complaint of anal problems. Note for "Anal problems": Patient returns status post incision and drainage of left anterior perirectal abscess 09/22/2015  Patient found to have increasing pain and swelling in his perineum. Underwent emergent incision and drainage of 5 cm perianal abscess in the left anterior region 3 months ago. Initially seen to be doing well. However he's had persistent drainage and his return to clinic a few times. Referred to me given concern of development of anal fistula now. Patient returns. He still has persistent drainage near the anus. Less pain now. However still leaks on underwear. No fevers chills or sweats. He almost quit smoking but has back to doing that. Moving his bowels every day. On a fiber supplement. Occasional blood when he wipes. No fall or trauma.  No personal nor family history of GI/colon cancer, inflammatory bowel disease, irritable bowel syndrome, allergy such as Celiac Sprue, dietary/dairy problems, colitis, ulcers nor gastritis. No recent sick contacts/gastroenteritis. No travel outside the country. No changes in diet. No dysphagia to solids or liquids. No significant heartburn or reflux. No hematochezia, hematemesis, coffee ground emesis. No evidence of prior gastric/peptic ulceration.     Patient Name: Roberto Mason   Date of Surgery: 09/22/2015  Pre op Diagnosis: Perirectal abscess  Post op Diagnosis: Perirectal abscess, left anterior  Procedure: Diagnostic anoscopy Incision and drainage perianal abscess Obtain anaerobic  and aerobic cultures  Surgeon: Angelia Mould. Derrell Lolling, M.D., FACS  Assistant: OR staff  Operative Indications: This is a 35 year old man from [facility] who presented with fever and pain in his perineum. This has been going on for just a few days. No prior history. No prior gastrointestinal disorders. Examination reveals a 4-5 cm abscess in the left anterior position, really about at 1:00 position. This does not appear to involve the scrotum or the anal sphincters.  Operative Findings: Anoscopy was normal. The distal 10 inches of rectal mucosa were perfectly healthy and without inflammatory change. There is no internal drainage. There is no mass bulging into the lumen. Anal sphincters had normal tone and were easily dilated. Minimal hemorrhoids. No fissure or fistula seen There was a large abscess at about the 12:30 position starting just below the base of the scrotum and going down to the skin outside the anoderm. This appeared to be a unilocular abscess. Purulent material was brownish tan. Cultures were taken   Allergies Fay Records, CMA; 01/03/2016 2:02 PM) No Known Drug Allergies 10/06/2015  Medication History Fay Records, [place]; 01/03/2016 2:03 PM) No Current Medications Medications Reconciled    Vitals Fay Records CMA; 01/03/2016 2:03 PM) 01/03/2016 2:03 PM Weight: 217 lb Height: 72in Body Surface Area: 2.21 m Body Mass Index: 29.43 kg/m  Temp.: 66F(Temporal)  Pulse: 89 (Regular)  Resp.: 18 (Unlabored)  BP: 126/80 (Sitting, Left Arm, Standard)      Physical Exam Ardeth Sportsman MD; 01/03/2016 2:27 PM)  General Mental Status-Alert. General Appearance-Not in acute distress. Voice-Normal.  Integumentary Global Assessment Upon inspection and palpation of skin surfaces of the - Distribution of scalp and body hair is normal. General Characteristics Overall examination of the  patient's skin reveals - no rashes and no suspicious lesions.  Head and Neck Head-normocephalic, atraumatic with no  lesions or palpable masses. Face Global Assessment - atraumatic, no absence of expression. Neck Global Assessment - no abnormal movements, no decreased range of motion. Trachea-midline. Thyroid Gland Characteristics - non-tender.  Eye Eyeball - Left-Extraocular movements intact, No Nystagmus. Eyeball - Right-Extraocular movements intact, No Nystagmus. Upper Eyelid - Left-No Cyanotic. Upper Eyelid - Right-No Cyanotic.  Chest and Lung Exam Inspection Accessory muscles - No use of accessory muscles in breathing.  Abdomen Note: Abdomen soft. Nontender, nondistended. No guarding. No umbilical no other hernias  Male Genitourinary Note: No inguinal hernias. Normal external genitalia. Epididymi, testes, and spermatic cords normal without any masses.  Rectal Note: Punctate sinus opening along left anterior midline raphae., 5 centimeters from the left anterior anal verge. Purulence expressed. Cord in SQ felt to probable small opening at anal verge. Suspicious for superficial perirectal/perineal fistula.  Perianal skin clean with good hygiene. No pruritis ani. No pilonidal disease. No fissure. Normal sphincter tone. Tolerates digital rectal exam. No rectal masses. Some increased purulent depression with anterior rectal wall palpation  Peripheral Vascular Upper Extremity Inspection - Left - Not Gangrenous, No Petechiae. Right - Not Gangrenous, No Petechiae.  Neurologic Neurologic evaluation reveals -normal attention span and ability to concentrate, able to name objects and repeat phrases. Appropriate fund of knowledge and normal coordination.  Neuropsychiatric Mental status exam performed with findings of-able to articulate well with normal speech/language, rate, volume and coherence and no evidence of hallucinations, delusions, obsessions or  homicidal/suicidal ideation. Orientation-oriented X3.  Musculoskeletal Global Assessment Gait and Station - normal gait and station.  Lymphatic General Lymphatics Description - No Generalized lymphadenopathy.    Assessment & Plan Ardeth Sportsman(Eleanore Junio C. 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  I think this will not heal until it is more properly excise. I recommended examination under anesthesia. Most likely will be excision of tract/superficial fistulotomy. If it is intersphincteric, may require LIFT or other complex repair. Because it is anterior, plan lithotomy position. Could try back/local anesthetic versus general. Last time he needed General.  I strongly recommended he quit smoking to lower the chance of recurrence and healing problems  Current Plans The anatomy & physiology of the anorectal region was discussed. We discussed the pathophysiology of anorectal abscess and fistula. Differential diagnosis was discussed. Natural history progression was discussed. I stressed the importance of a bowel regimen to have daily soft bowel movements to minimize progression of disease.  The patient's condition is not adequately controlled. Non-operative treatment has not healed the fistula. Therefore, I recommended examination under anaesthesia to confirm the diagnosis and treat the fistula. I discussed techniques that may be required such as fistulotomy, ligation by LIFT technique, and/or seton placement. Benefits & alternatives discussed. I noted a good likelihood this will help address the problem, but sometimes repeat operations and prolonged healing times may occur. Risks such as bleeding, pain, recurrence, reoperation, incontinence, heart attack, death, and other risks were discussed.  Educational handouts further explaining the pathology, treatment options, and bowel regimen were given.  The patient expressed understanding & wishes to proceed. We will work to coordinate surgery for a mutually convenient time.  Pt Education - [facility] Abscess/Fistula (AT): discussed with patient and provided information. ENCOUNTER FOR PREOPERATIVE EXAMINATION FOR GENERAL SURGICAL PROCEDURE (Z01.818)  Current Plans You are being scheduled for surgery - Our schedulers will call you.  You should hear from our office's scheduling department within 5 working days about the location, date,  and time of surgery. We try to make accommodations for patient's preferences in scheduling surgery, but sometimes the OR schedule or the surgeon's schedule prevents us from making those accommodations.  If you have not heard from our office [phone]) in 5 working days, call the office and ask for your surgeon's nurse.  If you have other questions about your diagnosis, plan, or surgery, call the office and ask for your surgeon's nurse.  Pt Education - [facility] Rectal Prep for Anorectal outpatient/office surgery: discussed with patient and provided information. Pt Education - [facility] Rectal Surgery HCI (Embrie Mikkelsen): discussed with patient and provided information. Pt Education - [facility] Hemorrhoids (Fares Ramthun): discussed with patient and provided information. Pt Education - [facility] Pelvic Floor Exercises (Kegels) and Dysfunction HCI (Marcus Schwandt)  STOP SMOKING! We talked to the patient about the dangers of smoking.  We stressed that tobacco use dramatically increases the risk of peri-operative complications such as infection, tissue necrosis leaving to problems with incision/wound and organ healing, hernia, chronic pain, heart attack, stroke, DVT, pulmonary embolism, and death.  We noted there are programs in our community to help stop smoking.  Information was available.   Ardeth SportsmanSteven C. Hobie Kohles, M.D., F.A.C.S. Gastrointestinal and Minimally Invasive Surgery [facility] [address] [place] [id number] [phone]  [phone] Main / Paging

## 2016-01-18 ENCOUNTER — Ambulatory Visit (INDEPENDENT_AMBULATORY_CARE_PROVIDER_SITE_OTHER): Payer: BLUE CROSS/BLUE SHIELD | Admitting: Nurse Practitioner

## 2016-01-18 ENCOUNTER — Encounter: Payer: Self-pay | Admitting: Nurse Practitioner

## 2016-01-18 VITALS — BP 120/88 | HR 75 | Ht 72.0 in | Wt 213.0 lb

## 2016-01-18 DIAGNOSIS — Z716 Tobacco abuse counseling: Secondary | ICD-10-CM | POA: Diagnosis not present

## 2016-01-18 DIAGNOSIS — F17200 Nicotine dependence, unspecified, uncomplicated: Secondary | ICD-10-CM | POA: Diagnosis not present

## 2016-01-18 DIAGNOSIS — K604 Rectal fistula: Secondary | ICD-10-CM | POA: Diagnosis not present

## 2016-01-18 DIAGNOSIS — K603 Anal fistula: Secondary | ICD-10-CM | POA: Insufficient documentation

## 2016-01-18 MED ORDER — VARENICLINE TARTRATE 0.5 MG X 11 & 1 MG X 42 PO MISC: ORAL | 0 refills | Status: DC

## 2016-01-18 NOTE — Patient Instructions (Signed)
Varenicline oral tablets What is this medicine? VARENICLINE (var EN i kleen) is used to help people quit smoking. It can reduce the symptoms caused by stopping smoking. It is used with a patient support program recommended by your physician. This medicine may be used for other purposes; ask your health care provider or pharmacist if you have questions. What should I tell my health care provider before I take this medicine? They need to know if you have any of these conditions: -bipolar disorder, depression, schizophrenia or other mental illness -heart disease -if you often drink alcohol -kidney disease -peripheral vascular disease -seizures -stroke -suicidal thoughts, plans, or attempt; a previous suicide attempt by you or a family member -an unusual or allergic reaction to varenicline, other medicines, foods, dyes, or preservatives -pregnant or trying to get pregnant -breast-feeding How should I use this medicine? Take this medicine by mouth after eating. Take with a full glass of water. Follow the directions on the prescription label. Take your doses at regular intervals. Do not take your medicine more often than directed. There are 3 ways you can use this medicine to help you quit smoking; talk to your health care professional to decide which plan is right for you: 1) you can choose a quit date and start this medicine 1 week before the quit date, or, 2) you can start taking this medicine before you choose a quit date, and then pick a quit date between day 8 and 35 days of treatment, or, 3) if you are not sure that you are able or willing to quit smoking right away, start taking this medicine and slowly decrease the amount you smoke as directed by your health care professional with the goal of being cigarette-free by week 12 of treatment. Stick to your plan; ask about support groups or other ways to help you remain cigarette-free. If you are motivated to quit smoking and did not succeed  during a previous attempt with this medicine for reasons other than side effects, or if you returned to smoking after this treatment, speak with your health care professional about whether another course of this medicine may be right for you. A special MedGuide will be given to you by the pharmacist with each prescription and refill. Be sure to read this information carefully each time. Talk to your pediatrician regarding the use of this medicine in children. This medicine is not approved for use in children. Overdosage: If you think you have taken too much of this medicine contact a poison control center or emergency room at once. NOTE: This medicine is only for you. Do not share this medicine with others. What if I miss a dose? If you miss a dose, take it as soon as you can. If it is almost time for your next dose, take only that dose. Do not take double or extra doses. What may interact with this medicine? -alcohol or any product that contains alcohol -insulin -other stop smoking aids -theophylline -warfarin This list may not describe all possible interactions. Give your health care provider a list of all the medicines, herbs, non-prescription drugs, or dietary supplements you use. Also tell them if you smoke, drink alcohol, or use illegal drugs. Some items may interact with your medicine. What should I watch for while using this medicine? Visit your doctor or health care professional for regular check ups. Ask for ongoing advice and encouragement from your doctor or healthcare professional, friends, and family to help you quit. If you smoke while on   this medication, quit again Your mouth may get dry. Chewing sugarless gum or sucking hard candy, and drinking plenty of water may help. Contact your doctor if the problem does not go away or is severe. You may get drowsy or dizzy. Do not drive, use machinery, or do anything that needs mental alertness until you know how this medicine affects you. Do  not stand or sit up quickly, especially if you are an older patient. This reduces the risk of dizzy or fainting spells. Sleepwalking can happen during treatment with this medicine, and can sometimes lead to behavior that is harmful to you, other people, or property. Stop taking this medicine and tell your doctor if you start sleepwalking or have other unusual sleep-related activity. Decrease the amount of alcoholic beverages that you drink during treatment with this medicine until you know if this medicine affects your ability to tolerate alcohol. Some people have experienced increased drunkenness (intoxication), unusual or sometimes aggressive behavior, or no memory of things that have happened (amnesia) during treatment with this medicine. The use of this medicine may increase the chance of suicidal thoughts or actions. Pay special attention to how you are responding while on this medicine. Any worsening of mood, or thoughts of suicide or dying should be reported to your health care professional right away. What side effects may I notice from receiving this medicine? Side effects that you should report to your doctor or health care professional as soon as possible: -allergic reactions like skin rash, itching or hives, swelling of the face, lips, tongue, or throat -acting aggressive, being angry or violent, or acting on dangerous impulses -breathing problems -changes in vision -chest pain or chest tightness -confusion, trouble speaking or understanding -new or worsening depression, anxiety, or panic attacks -extreme increase in activity and talking (mania) -fast, irregular heartbeat -feeling faint or lightheaded, falls -fever -pain in legs when walking -problems with balance, talking, walking -redness, blistering, peeling or loosening of the skin, including inside the mouth -ringing in ears -seeing or hearing things that aren't there (hallucinations) -seizures -sleepwalking -sudden numbness  or weakness of the face, arm or leg -thoughts about suicide or dying, or attempts to commit suicide -trouble passing urine or change in the amount of urine -unusual bleeding or bruising -unusually weak or tired Side effects that usually do not require medical attention (report to your doctor or health care professional if they continue or are bothersome): -constipation -headache -nausea, vomiting -strange dreams -stomach gas -trouble sleeping This list may not describe all possible side effects. Call your doctor for medical advice about side effects. You may report side effects to FDA at 1-800-FDA-1088. Where should I keep my medicine? Keep out of the reach of children. Store at room temperature between 15 and 30 degrees C (59 and 86 degrees F). Throw away any unused medicine after the expiration date. NOTE: This sheet is a summary. It may not cover all possible information. If you have questions about this medicine, talk to your doctor, pharmacist, or health care provider.    2016, [employer]vier/Gold Standard. (2015-01-21 16:14:23)  

## 2016-01-18 NOTE — Progress Notes (Signed)
Subjective:    Patient ID: Roberto Mason, male    DOB: 03/24/1981, 35 y.o.   MRN: [id number]   Patient presents today to establish care (new patient) and discuss use to chnatix to quit tobacco use.  Nicotine Dependence  Presents for initial visit. Preferred tobacco types include cigarettes. Preferred cigarette types include filtered. Preferred strength is regular. Preferred cigarettes are non-menthol. His urge triggers include company of smokers. He smokes 1 pack of cigarettes per day. He started smoking when he was [age] years old. Past treatments include varenicline. The treatment provided significant relief. Compliance with prior treatments has been good. Kert is thinking about quitting (wants to quit but has not set date at this time. will like to quit prior to fistula repair surgery.). Khamani has tried to quit 1 time. There is no history of alcohol abuse and drug use.    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 Vision: never done Dental: up to date Sexual History (birth control, marital status, STD): married with 2sons, not concerned about HIV status, never treated for any STD, declined HIV testing.  Medications and allergies reviewed with patient and updated if appropriate.  Patient Active Problem List   Diagnosis Date Noted  . 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 omeprazole (PRILOSEC) 40 MG capsule Take 40 mg by mouth daily.     No current facility-administered medications on file prior to visit.     Past Medical History:  Diagnosis Date  . Allergy   . GERD (gastroesophageal reflux disease)   . Rectal  fistula    secondary to perirectal abscess 4months ago    Past Surgical History:  Procedure Laterality Date  . IRRIGATION AND DEBRIDEMENT ABSCESS N/A 09/22/2015   Procedure: anoscope IRRIGATION AND DEBRIDEMENT ABSCESS;  Surgeon: Claud KelpHaywood Ingram, MD;  Location: [facility];  Service: General;  Laterality: N/A;    Social History   Social History  . Marital status: Married    Spouse name: N/A  . Number of children: N/A  . Years of education: N/A   Social History Main Topics  . Smoking status: Current Every Day Smoker    Packs/day: 1.00    Types: Cigarettes  . Smokeless tobacco: Current User    Types: Chew     Comment: success with chnatix in past, quit for 1year  . Alcohol use No  . Drug use: No  . Sexual activity: Yes   Other Topics Concern  . None   Social History Narrative  . None    Family History  Problem Relation Age of Onset  . Glaucoma Mother   . Heart disease Father   . Hyperlipidemia Father   . Hypertension Father   . Heart failure Father   . Diabetes Maternal Grandmother   . Diabetes Maternal Grandfather   . Diabetes Paternal Grandmother   . Diabetes Paternal Grandfather   . COPD Paternal Grandfather   . Lung cancer Paternal Grandfather     Review of Systems  Constitutional: Negative.   HENT: Negative for congestion, postnasal drip, rhinorrhea and sinus pressure.   Eyes: Negative for visual disturbance.  Respiratory: Negative for cough and shortness of breath.  Cardiovascular: Negative for chest pain, palpitations and leg swelling.  Gastrointestinal: Negative for abdominal pain, blood in stool, constipation, diarrhea, nausea, rectal pain and vomiting.       Rectal fistula, waiting for appt with surgeon.  Genitourinary: Negative for decreased urine volume, dysuria, frequency, penile pain, testicular pain and urgency.  Musculoskeletal: Negative for arthralgias, back pain, joint swelling and myalgias.  Allergic/Immunologic: Positive for environmental  allergies.  Neurological: Negative for headaches.  Hematological: Negative for adenopathy.  Psychiatric/Behavioral: Negative for sleep disturbance and suicidal ideas. The patient is not nervous/anxious.         Objective:   Vitals:   01/18/16 0939  BP: 120/88  Pulse: 75    Body mass index is 28.89 kg/m.  <ECGINTERP>  Physical Examination:  Physical Exam  Constitutional: He is oriented to person, place, and time. He appears well-developed and well-nourished. No distress.  HENT:  Right Ear: External ear normal.  Left Ear: External ear normal.  Mouth/Throat: Oropharynx is clear and moist. No oropharyngeal exudate.  Eyes: Conjunctivae and EOM are normal. Pupils are equal, round, and reactive to light.  Neck: Normal range of motion. Neck supple. No thyromegaly present.  Cardiovascular: Normal rate, regular rhythm and normal heart sounds.   Pulmonary/Chest: Effort normal and breath sounds normal.  Abdominal: Soft. Bowel sounds are normal. He exhibits no distension. There is no tenderness.  Lymphadenopathy:    He has no cervical adenopathy.  Neurological: He is alert and oriented to person, place, and time. He has normal reflexes.  Skin: Skin is warm and dry.  Vitals reviewed.   ASSESSMENT and PLAN:  Duanne was seen today for establish care and nicotine dependence.  Diagnoses and all orders for this visit:  Encounter for tobacco use cessation counseling -     varenicline (CHANTIX PAK) 0.5 MG X 11 & 1 MG X 42 tablet; Take one 0.5 mg tablet by mouth once daily for 3 days, then increase to one 0.5 mg tablet twice daily for 4 days, then increase to one 1 mg tablet twice daily.  Tobacco use disorder -     varenicline (CHANTIX PAK) 0.5 MG X 11 & 1 MG X 42 tablet; Take one 0.5 mg tablet by mouth once daily for 3 days, then increase to one 0.5 mg tablet twice daily for 4 days, then increase to one 1 mg tablet twice daily.  Rectal fistula   Follow up in 2months for CPE and re  eval of chnatix use

## 2016-01-18 NOTE — Assessment & Plan Note (Signed)
secondary to poor healing perirectal abscess. Abscess has resolved, but upcoming surgery to repair fistula.  Unknown date at this time

## 2016-01-21 NOTE — Progress Notes (Signed)
ekg 5/17 epic 

## 2016-01-21 NOTE — Patient Instructions (Addendum)
Jerene CannyDarren R Eastep  01/21/2016   Your procedure is scheduled on: 01/27/16  [facility]  Entrance take [place]  elevators to 3rd floor to  Short Stay Center at  0530 AM.  Call this number if you have problems the morning of surgery [phone] RECTAL PREP INSTRUCTIONS--START DRINKING CLEAR LIQUIDS THE DAY BEFORE SURGERY--MAY EAT PUREED FOODS DAY BEFORE.Marland Kitchen.INCREASE FLUID INTAKE.  AT 100PM--TAKE 2 OUNCES OF MILK OF MAGNESIA.  MAY REPEAT IF NOT EFFECTIVE.  Remember: ONLY 1 PERSON MAY GO WITH YOU TO SHORT STAY TO GET  READY MORNING OF YOUR SURGERY.  Do not eat food or drink liquids :After Midnight. FLEETS ENEMA PER RECTUM MORNING OF PROCEDURE    Take these medicines the morning of surgery with A SIP OF WATER: Omeprazole DO NOT TAKE ANY DIABETIC MEDICATIONS DAY OF YOUR SURGERY                               You may not have any metal on your body including hair pins and              piercings  Do not wear jewelry, make-up, lotions, powders or perfumes, deodorant             Do not wear nail polish.  Do not shave  48 hours prior to surgery.              Men may shave face and neck.   Do not bring valuables to the hospital. [facility] IS NOT             RESPONSIBLE   FOR VALUABLES.  Contacts, dentures or bridgework may not be worn into surgery.  Leave suitcase in the car. After surgery it may be brought to your room.     Patients discharged the day of surgery will not be allowed to drive home.  Name and phone number of your driver:mother  Eber JonesCarolyn  [phone]  Special Instructions: N/A              Please read over the following fact sheets you were given: _____________________________________________________________________             [facility] Pre[facility] Before surgery, you can play an important role.  Because skin is not sterile, your skin needs to be as free of germs as possible.  You can reduce the number of germs on your skin by washing  with CHG (chlorahexidine gluconate) soap before surgery.  CHG is an antiseptic cleaner which kills germs and bonds with the skin to continue killing germs even after washing. Please DO NOT use if you have an allergy to CHG or antibacterial soaps.  If your skin becomes reddened/irritated stop using the CHG and inform your nurse when you arrive at Short Stay. Do not shave (including legs and underarms) for at least 48 hours prior to the first CHG shower.  You may shave your face/neck. Please follow these instructions carefully:  1.  Shower with CHG Soap the night before surgery and the  morning of Surgery.  2.  If you choose to wash your hair, wash your hair first as usual with your  normal  shampoo.  3.  After you shampoo, rinse your hair and body thoroughly to remove the  shampoo.  4.  Use CHG as you would any other liquid soap.  You can apply chg directly  to the skin and wash                       Gently with a scrungie or clean washcloth.  5.  Apply the CHG Soap to your body ONLY FROM THE NECK DOWN.   Do not use on face/ open                           Wound or open sores. Avoid contact with eyes, ears mouth and genitals (private parts).                       Wash face,  Genitals (private parts) with your normal soap.             6.  Wash thoroughly, paying special attention to the area where your surgery  will be performed.  7.  Thoroughly rinse your body with warm water from the neck down.  8.  DO NOT shower/wash with your normal soap after using and rinsing off  the CHG Soap.                9.  Pat yourself dry with a clean towel.            10.  Wear clean pajamas.            11.  Place clean sheets on your bed the night of your first shower and do not  sleep with pets. Day of Surgery : Do not apply any lotions/deodorants the morning of surgery.  Please wear clean clothes to the hospital/surgery center.  FAILURE TO FOLLOW THESE INSTRUCTIONS MAY RESULT IN THE  CANCELLATION OF YOUR SURGERY PATIENT SIGNATURE_________________________________  NURSE SIGNATURE__________________________________  ________________________________________________________________________

## 2016-01-25 ENCOUNTER — Encounter (HOSPITAL_COMMUNITY): Payer: Self-pay

## 2016-01-25 ENCOUNTER — Encounter (HOSPITAL_COMMUNITY)
Admission: RE | Admit: 2016-01-25 | Discharge: 2016-01-25 | Disposition: A | Discharge status: Home / Self Care | Payer: BLUE CROSS/BLUE SHIELD | Source: Ambulatory Visit | Attending: Surgery | Admitting: Surgery

## 2016-01-25 DIAGNOSIS — F17200 Nicotine dependence, unspecified, uncomplicated: Secondary | ICD-10-CM | POA: Diagnosis not present

## 2016-01-25 DIAGNOSIS — IMO0001 Reserved for inherently not codable concepts without codable children: Secondary | ICD-10-CM

## 2016-01-25 DIAGNOSIS — K604 Rectal fistula: Secondary | ICD-10-CM | POA: Diagnosis present

## 2016-01-25 DIAGNOSIS — K649 Unspecified hemorrhoids: Secondary | ICD-10-CM | POA: Diagnosis not present

## 2016-01-25 HISTORY — DX: Reserved for inherently not codable concepts without codable children: IMO0001

## 2016-01-25 HISTORY — DX: Other specified postprocedural states: Z98.890

## 2016-01-25 HISTORY — DX: Nausea with vomiting, unspecified: R11.2

## 2016-01-25 LAB — CBC
HCT: 45.9 % (ref 39.0–52.0)
Hemoglobin: 15.3 g/dL (ref 13.0–17.0)
MCH: 28.5 pg (ref 26.0–34.0)
MCHC: 33.3 g/dL (ref 30.0–36.0)
MCV: 85.6 fL (ref 78.0–100.0)
Platelets: 296 10*3/uL (ref 150–400)
RBC: 5.36 MIL/uL (ref 4.22–5.81)
RDW: 13.7 % (ref 11.5–15.5)
WBC: 8.6 10*3/uL (ref 4.0–10.5)

## 2016-01-26 NOTE — Anesthesia Preprocedure Evaluation (Addendum)
Anesthesia Evaluation  Patient identified by MRN, date of birth, ID band Patient awake    Reviewed: Allergy & Precautions, NPO status , Patient's Chart, lab work & pertinent test results  History of Anesthesia Complications (+) PONV and history of anesthetic complications  Airway Mallampati: II  TM Distance: >3 FB Neck ROM: Full    Dental no notable dental hx.    Pulmonary Current Smoker,    Pulmonary exam normal breath sounds clear to auscultation       Cardiovascular negative cardio ROS Normal cardiovascular exam Rhythm:Regular Rate:Normal     Neuro/Psych negative neurological ROS  negative psych ROS   GI/Hepatic Neg liver ROS, GERD  ,  Endo/Other  negative endocrine ROS  Renal/GU negative Renal ROS  negative genitourinary   Musculoskeletal negative musculoskeletal ROS (+)   Abdominal   Peds negative pediatric ROS (+)  Hematology negative hematology ROS (+)   Anesthesia Other Findings   Reproductive/Obstetrics negative OB ROS                             Anesthesia Physical Anesthesia Plan  ASA: II  Anesthesia Plan: General   Post-op Pain Management:    Induction: Intravenous  Airway Management Planned: Oral ETT  Additional Equipment:   Intra-op Plan:   Post-operative Plan: Extubation in OR  Informed Consent: I have reviewed the patients History and Physical, chart, labs and discussed the procedure including the risks, benefits and alternatives for the proposed anesthesia with the patient or authorized representative who has indicated his/her understanding and acceptance.   Dental advisory given  Plan Discussed with: CRNA  Anesthesia Plan Comments:         Anesthesia Quick Evaluation

## 2016-01-27 ENCOUNTER — Encounter (HOSPITAL_COMMUNITY)
Admission: RE | Disposition: A | Discharge status: Home / Self Care | Payer: Self-pay | Source: Ambulatory Visit | Attending: Surgery

## 2016-01-27 ENCOUNTER — Ambulatory Visit (HOSPITAL_COMMUNITY)
Admission: RE | Admit: 2016-01-27 | Discharge: 2016-01-27 | Disposition: A | Discharge status: Home / Self Care | Payer: BLUE CROSS/BLUE SHIELD | Source: Ambulatory Visit | Attending: Surgery | Admitting: Surgery

## 2016-01-27 ENCOUNTER — Ambulatory Visit (HOSPITAL_COMMUNITY): Payer: BLUE CROSS/BLUE SHIELD | Admitting: Anesthesiology

## 2016-01-27 ENCOUNTER — Encounter (HOSPITAL_COMMUNITY): Payer: Self-pay | Admitting: *Deleted

## 2016-01-27 DIAGNOSIS — K604 Rectal fistula: Secondary | ICD-10-CM | POA: Insufficient documentation

## 2016-01-27 DIAGNOSIS — K603 Anal fistula: Secondary | ICD-10-CM | POA: Diagnosis present

## 2016-01-27 DIAGNOSIS — K649 Unspecified hemorrhoids: Secondary | ICD-10-CM | POA: Insufficient documentation

## 2016-01-27 DIAGNOSIS — Z720 Tobacco use: Secondary | ICD-10-CM

## 2016-01-27 DIAGNOSIS — F17200 Nicotine dependence, unspecified, uncomplicated: Secondary | ICD-10-CM | POA: Insufficient documentation

## 2016-01-27 HISTORY — PX: RECTAL EXAM UNDER ANESTHESIA: SHX6399

## 2016-01-27 HISTORY — PX: FISTULA PLUG: SHX5831

## 2016-01-27 SURGERY — EXAM UNDER ANESTHESIA, RECTUM
Anesthesia: General

## 2016-01-27 MED ORDER — PROMETHAZINE HCL 25 MG/ML IJ SOLN
INTRAMUSCULAR | Status: AC
  Filled 2016-01-27: qty 1

## 2016-01-27 MED ORDER — ACETAMINOPHEN 500 MG PO TABS
1000.0000 mg | ORAL_TABLET | ORAL | Status: AC
  Administered 2016-01-27: 1000 mg via ORAL
  Filled 2016-01-27: qty 2

## 2016-01-27 MED ORDER — GABAPENTIN 300 MG PO CAPS
300.0000 mg | ORAL_CAPSULE | ORAL | Status: AC
  Administered 2016-01-27: 300 mg via ORAL
  Filled 2016-01-27: qty 1

## 2016-01-27 MED ORDER — METHYLENE BLUE 0.5 % INJ SOLN
INTRAVENOUS | Status: AC
Start: 2016-01-27 — End: 2016-01-27
  Filled 2016-01-27: qty 10

## 2016-01-27 MED ORDER — ROCURONIUM BROMIDE 100 MG/10ML IV SOLN
INTRAVENOUS | Status: DC | PRN
  Administered 2016-01-27: 20 mg via INTRAVENOUS
  Administered 2016-01-27: 10 mg via INTRAVENOUS

## 2016-01-27 MED ORDER — SUCCINYLCHOLINE CHLORIDE 200 MG/10ML IV SOSY
PREFILLED_SYRINGE | INTRAVENOUS | Status: DC | PRN
  Administered 2016-01-27: 100 mg via INTRAVENOUS

## 2016-01-27 MED ORDER — HYDROCODONE-ACETAMINOPHEN 5-325 MG PO TABS: 1.0000 | ORAL_TABLET | ORAL | 0 refills | Status: DC | PRN

## 2016-01-27 MED ORDER — MIDAZOLAM HCL 2 MG/2ML IJ SOLN
INTRAMUSCULAR | Status: AC
  Filled 2016-01-27: qty 2

## 2016-01-27 MED ORDER — CHLORHEXIDINE GLUCONATE CLOTH 2 % EX PADS: 6.0000 | MEDICATED_PAD | Freq: Once | CUTANEOUS | Status: DC

## 2016-01-27 MED ORDER — CELECOXIB 200 MG PO CAPS
400.0000 mg | ORAL_CAPSULE | ORAL | Status: AC
  Administered 2016-01-27: 400 mg via ORAL
  Filled 2016-01-27: qty 2

## 2016-01-27 MED ORDER — METRONIDAZOLE IN NACL 5-0.79 MG/ML-% IV SOLN
500.0000 mg | INTRAVENOUS | Status: AC
  Administered 2016-01-27: 500 mg via INTRAVENOUS

## 2016-01-27 MED ORDER — CEFAZOLIN SODIUM-DEXTROSE 2-4 GM/100ML-% IV SOLN
2.0000 g | INTRAVENOUS | Status: AC
  Administered 2016-01-27: 2 g via INTRAVENOUS

## 2016-01-27 MED ORDER — OXYCODONE HCL 5 MG PO TABS: 5.0000 mg | ORAL_TABLET | Freq: Four times a day (QID) | ORAL | 0 refills | Status: DC | PRN

## 2016-01-27 MED ORDER — BUPIVACAINE-EPINEPHRINE 0.5% -1:200000 IJ SOLN
INTRAMUSCULAR | Status: DC | PRN
Start: 2016-01-27 — End: 2016-01-27
  Administered 2016-01-27: 20 mL

## 2016-01-27 MED ORDER — SODIUM CHLORIDE 0.9 % IJ SOLN
INTRAMUSCULAR | Status: AC
  Filled 2016-01-27: qty 10

## 2016-01-27 MED ORDER — BUPIVACAINE-EPINEPHRINE (PF) 0.5% -1:200000 IJ SOLN
INTRAMUSCULAR | Status: AC
  Filled 2016-01-27: qty 30

## 2016-01-27 MED ORDER — MIDAZOLAM HCL 5 MG/5ML IJ SOLN
INTRAMUSCULAR | Status: DC | PRN
  Administered 2016-01-27: 2 mg via INTRAVENOUS

## 2016-01-27 MED ORDER — ONDANSETRON HCL 4 MG/2ML IJ SOLN
INTRAMUSCULAR | Status: AC
  Filled 2016-01-27: qty 2

## 2016-01-27 MED ORDER — NAPROXEN 500 MG PO TABS: 500.0000 mg | ORAL_TABLET | Freq: Two times a day (BID) | ORAL | 1 refills | Status: DC

## 2016-01-27 MED ORDER — BUPIVACAINE LIPOSOME 1.3 % IJ SUSP
INTRAMUSCULAR | Status: DC | PRN
  Administered 2016-01-27: 20 mL

## 2016-01-27 MED ORDER — FENTANYL CITRATE (PF) 100 MCG/2ML IJ SOLN
INTRAMUSCULAR | Status: AC
  Filled 2016-01-27: qty 2

## 2016-01-27 MED ORDER — PROPOFOL 10 MG/ML IV BOLUS
INTRAVENOUS | Status: AC
  Filled 2016-01-27: qty 20

## 2016-01-27 MED ORDER — ONDANSETRON HCL 4 MG/2ML IJ SOLN
INTRAMUSCULAR | Status: DC | PRN
  Administered 2016-01-27: 4 mg via INTRAVENOUS

## 2016-01-27 MED ORDER — DIBUCAINE 1 % RE OINT
TOPICAL_OINTMENT | RECTAL | Status: AC
  Filled 2016-01-27: qty 28

## 2016-01-27 MED ORDER — LIDOCAINE 2% (20 MG/ML) 5 ML SYRINGE
INTRAMUSCULAR | Status: AC
  Filled 2016-01-27: qty 5

## 2016-01-27 MED ORDER — DIBUCAINE 1 % EX OINT
TOPICAL_OINTMENT | CUTANEOUS | Status: DC | PRN
  Administered 2016-01-27: 1 via TOPICAL

## 2016-01-27 MED ORDER — CEFAZOLIN SODIUM-DEXTROSE 2-4 GM/100ML-% IV SOLN
INTRAVENOUS | Status: AC
  Filled 2016-01-27: qty 100

## 2016-01-27 MED ORDER — FENTANYL CITRATE (PF) 100 MCG/2ML IJ SOLN
INTRAMUSCULAR | Status: DC | PRN
  Administered 2016-01-27: 100 ug via INTRAVENOUS
  Administered 2016-01-27 (×2): 50 ug via INTRAVENOUS

## 2016-01-27 MED ORDER — SUGAMMADEX SODIUM 200 MG/2ML IV SOLN
INTRAVENOUS | Status: AC
  Filled 2016-01-27: qty 2

## 2016-01-27 MED ORDER — LACTATED RINGERS IV SOLN
INTRAVENOUS | Status: DC
Start: 2016-01-27 — End: 2016-01-27

## 2016-01-27 MED ORDER — FENTANYL CITRATE (PF) 100 MCG/2ML IJ SOLN: 25.0000 ug | INTRAMUSCULAR | Status: DC | PRN

## 2016-01-27 MED ORDER — LIDOCAINE HCL (CARDIAC) 20 MG/ML IV SOLN
INTRAVENOUS | Status: DC | PRN
  Administered 2016-01-27: 60 mg via INTRAVENOUS

## 2016-01-27 MED ORDER — PROPOFOL 10 MG/ML IV BOLUS
INTRAVENOUS | Status: DC | PRN
  Administered 2016-01-27: 200 mg via INTRAVENOUS

## 2016-01-27 MED ORDER — BUPIVACAINE LIPOSOME 1.3 % IJ SUSP
20.0000 mL | INTRAMUSCULAR | Status: DC
  Filled 2016-01-27: qty 20

## 2016-01-27 MED ORDER — DEXAMETHASONE SODIUM PHOSPHATE 4 MG/ML IJ SOLN
INTRAMUSCULAR | Status: DC | PRN
  Administered 2016-01-27: 10 mg via INTRAVENOUS

## 2016-01-27 MED ORDER — METHYLENE BLUE 0.5 % INJ SOLN
INTRAVENOUS | Status: DC | PRN
  Administered 2016-01-27: 3.5 mL

## 2016-01-27 MED ORDER — KETOROLAC TROMETHAMINE 30 MG/ML IJ SOLN
INTRAMUSCULAR | Status: AC
  Filled 2016-01-27: qty 1

## 2016-01-27 MED ORDER — PROMETHAZINE HCL 25 MG/ML IJ SOLN
6.2500 mg | INTRAMUSCULAR | Status: DC | PRN
  Administered 2016-01-27: 6.25 mg via INTRAVENOUS

## 2016-01-27 MED ORDER — METRONIDAZOLE IN NACL 5-0.79 MG/ML-% IV SOLN
INTRAVENOUS | Status: AC
  Filled 2016-01-27: qty 100

## 2016-01-27 MED ORDER — KETOROLAC TROMETHAMINE 30 MG/ML IJ SOLN
INTRAMUSCULAR | Status: DC | PRN
  Administered 2016-01-27: 30 mg via INTRAVENOUS

## 2016-01-27 MED ORDER — DEXAMETHASONE SODIUM PHOSPHATE 10 MG/ML IJ SOLN
INTRAMUSCULAR | Status: AC
  Filled 2016-01-27: qty 1

## 2016-01-27 MED ORDER — ROCURONIUM BROMIDE 10 MG/ML (PF) SYRINGE
PREFILLED_SYRINGE | INTRAVENOUS | Status: AC
  Filled 2016-01-27: qty 10

## 2016-01-27 MED ORDER — LACTATED RINGERS IV SOLN
INTRAVENOUS | Status: DC | PRN
  Administered 2016-01-27: 1000 mL
  Administered 2016-01-27: 07:00:00 via INTRAVENOUS

## 2016-01-27 SURGICAL SUPPLY — 30 items
BLADE SURG 15 STRL LF DISP TIS (BLADE) ×1 IMPLANT
BLADE SURG 15 STRL SS (BLADE) ×1
BRIEF STRETCH FOR OB PAD LRG (UNDERPADS AND DIAPERS) ×2 IMPLANT
COVER SURGICAL LIGHT HANDLE (MISCELLANEOUS) ×2 IMPLANT
DRAPE LAPAROTOMY T 102X78X121 (DRAPES) ×2 IMPLANT
DRSG PAD ABDOMINAL 8X10 ST (GAUZE/BANDAGES/DRESSINGS) ×2 IMPLANT
ELECT PENCIL ROCKER SW 15FT (MISCELLANEOUS) ×2 IMPLANT
ELECT REM PT RETURN 9FT ADLT (ELECTROSURGICAL) ×2
ELECTRODE REM PT RTRN 9FT ADLT (ELECTROSURGICAL) ×1 IMPLANT
GAUZE SPONGE 4X4 12PLY STRL (GAUZE/BANDAGES/DRESSINGS) ×2 IMPLANT
GAUZE SPONGE 4X4 16PLY XRAY LF (GAUZE/BANDAGES/DRESSINGS) ×2 IMPLANT
GLOVE ECLIPSE 8.0 STRL XLNG CF (GLOVE) ×2 IMPLANT
GLOVE INDICATOR 8.0 STRL GRN (GLOVE) ×2 IMPLANT
GOWN STRL REUS W/TWL XL LVL3 (GOWN DISPOSABLE) ×4 IMPLANT
KIT BASIN OR (CUSTOM PROCEDURE TRAY) ×2 IMPLANT
LUBRICANT JELLY K Y 4OZ (MISCELLANEOUS) ×2 IMPLANT
NEEDLE HYPO 22GX1.5 SAFETY (NEEDLE) ×2 IMPLANT
PACK BASIC VI WITH GOWN DISP (CUSTOM PROCEDURE TRAY) ×2 IMPLANT
SUCTION FRAZIER HANDLE 12FR (TUBING) ×1
SUCTION TUBE FRAZIER 12FR DISP (TUBING) ×1 IMPLANT
SUT CHROMIC 2 0 SH (SUTURE) ×4 IMPLANT
SUT CHROMIC 3 0 SH 27 (SUTURE) IMPLANT
SUT VIC AB 2-0 UR6 27 (SUTURE) ×6 IMPLANT
SWAB COLLECTION DEVICE MRSA (MISCELLANEOUS) IMPLANT
SWAB CULTURE ESWAB REG 1ML (MISCELLANEOUS) IMPLANT
SYR 20CC LL (SYRINGE) ×2 IMPLANT
SYR BULB IRRIGATION 50ML (SYRINGE) ×2 IMPLANT
TOWEL OR 17X26 10 PK STRL BLUE (TOWEL DISPOSABLE) ×2 IMPLANT
TOWEL OR NON WOVEN STRL DISP B (DISPOSABLE) ×2 IMPLANT
YANKAUER SUCT BULB TIP 10FT TU (MISCELLANEOUS) ×2 IMPLANT

## 2016-01-27 NOTE — Op Note (Signed)
01/27/2016  8:52 AM  PATIENT:  Roberto Mason  35 y.o. male  Patient Care Team: Anne Ng, NP as PCP - General (Internal Medicine) Karie Soda, MD as Consulting Physician (General Surgery)  PRE-OPERATIVE DIAGNOSIS:  Perirectal fistula  POST-OPERATIVE DIAGNOSIS:  Intersphicnteric Perirectal fistula  PROCEDURE:   RECTAL EXAM UNDER ANESTHESIA LIFT (ligation of intersphincteric fistulous tract) REPAIR OF PERIRECTAL FISTULA   SURGEON:  Surgeon(s): Karie Soda, MD  ASSISTANT: Octavia Heir, PA-S, [employer]  ANESTHESIA:   Local field block Anorectal block General  0.25% bupivacaine with epinephrine at the beginning of the case.  Liposomal bupivacaine (Experel) at the end of the case.  EBL:  No intake/output data recorded.  Delay start of Pharmacological VTE agent (>24hrs) due to surgical blood loss or risk of bleeding:  no  DRAINS: none   SPECIMEN:  Source of Specimen:  Perirectal Fistulous tract  DISPOSITION OF SPECIMEN:  PATHOLOGY  COUNTS:  YES  PLAN OF CARE: Discharge to home after PACU  PATIENT DISPOSITION:  PACU - hemodynamically stable.  INDICATION: Patient with perirectal abscess status post incision and drainage.  He has had persistent drainage for several months despite intervention.  There is suspicious for a probable perirectal fistula.  I recommended examination and surgical treatment:  The anatomy & physiology of the anorectal region was discussed.  We discussed the pathophysiology of anorectal abscess and fistula.  Differential diagnosis was discussed.  Natural history progression was discussed.   I stressed the importance of a bowel regimen to have daily soft bowel movements to minimize progression of disease.     The patient's condition is not adequately controlled.  Non-operative treatment has not healed the fistula.  Therefore, I recommended examination under anaesthesia to confirm the diagnosis and treat the fistula.  I discussed  techniques that may be required such as fistulotomy, ligation by LIFT technique, and/or seton placement.  Benefits & alternatives discussed.  I noted a good likelihood this will help address the problem, but sometimes repeat operations and prolonged healing times may occur.  Risks such as bleeding, pain, recurrence, reoperation, incontinence, heart attack, death, and other risks were discussed.      Educational handouts further explaining the pathology, treatment options, and bowel regimen were given.  The patient expressed understanding & wishes to proceed.  We will work to coordinate surgery for a mutually convenient time.   OR FINDINGS: Patient had an intersphincteric fistula.    External location left anterior midline near the base of scrotum.  Around 1:00 in classic lithotomy position.    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  DESCRIPTION:   Informed consent was confirmed. Patient underwent general anesthesia without difficulty. Patient was placed into prone positioning.  The perianal region was prepped and draped in sterile fashion. Surgical timeout confirmed or plan.  I did digital rectal examination and then transitioned over to anoscopy to get a sense of the anatomy.  I did place a probe through the external opening.  The probe would go up towards the sphincter complex and a believe into it.  However I cannot find a definite internal opening.  He did have some scarring in the anterior anal verge but no opening there.  Therefore, I injected the track with methylene blue.  With this I was able to locate an exit point at an anal crypt at the the anterior midline.  With that I could better redirect a more narrow probe towards this crypt.  The probe went into the sphincter complex and was not  Superficial.   This confirmed that it was intersphincteric.  No abscess located.  I went ahead and proceeded with the LIFT technique. I made a transverse incision at  the anal verge at the squamocolumnar junction.  He had scarring here which I excised.  Did careful dissection to get down to the sphincter.  I carefully went between the internal and external sphincter using careful blunt dissection parallel to the fibers.  I was able to locate the intersphincteric component of the fistulous tract.  I was able to get around it gently with a right angle clamp.  I carefully skeletonized the intersphincteric component.  I placed 2-0 Vicryl stitches as suture ligature through the intersphincteric tract on the rectal side and on the external side in between the external & internal sphincters.  I transected the fistulous tract.    I then transitioned to the rectal component.  He had a mildly prominent hemorrhoidal pile in the anterior midline.  Therefore I did a 2-0 Vicryl suture ligature about 4 cm proximal anal verge.  I then excised that hemorrhoid along with the internal opening anal crypt in a longitudinal elliptical excision.  I then ran suture to close that down to level anal verge and ran back up and tied down for good hemostasis.  This obliterated the internal opening.  Hemostasis was excellent.   I began to excise the external opening with a radial biconcave incision around it.  I transitioned to cautery and help free the fistulous tract circumferentially all way down towards the sphincter component.  I removed the superficial end of the fistulous tract just outside the sphinceter component & ligated the sphincter side with 2-0 vicryl suture.  I then excised some excess tissue so it was a more broad open flat wound that should be resistant to re-fistulization.  I reexamined the anal canal.   There is was no narrowing.  Hemostasis was excellent.  I repeated anoscopy and examination.  Hemostasis was good. I packed the external wound with 4 x 4 gauze.  Patient is being extubated go to recovery room.  I discussed operative findings, updated the patient's status, discussed  probable steps to recovery, and gave postoperative recommendations to the patient's family.  Recommendations were made.  Questions were answered.  They expressed understanding & appreciation. .  Instructions are written as well.  Ardeth SportsmanSteven C. Johnrobert Foti, M.D., F.A.C.S. Gastrointestinal and Minimally Invasive Surgery [facility] [address] [place], [place] [id number] [phone] Main / Paging

## 2016-01-27 NOTE — Discharge Instructions (Signed)
ANORECTAL SURGERY:  °POST OPERATIVE INSTRUCTIONS ° °###################################################################### ° °EAT °Gradually transition to a high fiber diet with a fiber supplement over the next few weeks after discharge.  Start with a pureed / full liquid diet (see below) ° °WALK °Walk an hour a day.  Control your pain to do that.   ° °CONTROL PAIN °Control pain so that you can walk, sleep, tolerate sneezing/coughing, go up/down stairs. ° °HAVE A BOWEL MOVEMENT DAILY °Keep your bowels regular to avoid problems.  OK to try a laxative to override constipation.  OK to use an antidairrheal to slow down diarrhea.  Call if not better after 2 tries ° °CALL IF YOU HAVE PROBLEMS/CONCERNS °Call if you are still struggling despite following these instructions. °Call if you have concerns not answered by these instructions ° °###################################################################### ° ° ° °1. Take your usually prescribed home medications unless otherwise directed. °2. DIET: Follow a light bland diet the first 24 hours after arrival home, such as soup, liquids, crackers, etc.  Be sure to include lots of fluids daily.  Avoid fast food or heavy meals as your are more likely to get nauseated.  Eat a low fat the next few days after surgery.   °3. PAIN CONTROL: °a. Pain is best controlled by a usual combination of three different methods TOGETHER: °i. Ice/Heat °ii. Over the counter pain medication °iii. Prescription pain medication °b. Most patients will experience some swelling and discomfort in the anus/rectal area. and incisions.  Ice packs or heat (30-60 minutes up to 6 times a day) will help. Use ice for the first few days to help decrease swelling and bruising, then switch to heat such as warm towels, sitz baths, warm baths, etc to help relax tight/sore spots and speed recovery.  Some people prefer to use ice alone, heat alone, alternating between ice & heat.  Experiment to what works for you.   Swelling and bruising can take several weeks to resolve.   °c. It is helpful to take an over-the-counter pain medication regularly for the first few weeks.  Choose one of the following that works best for you: °i. Naproxen (Aleve, etc)  Two 220mg tabs twice a day °ii. Ibuprofen (Advil, etc) Three 200mg tabs four times a day (every meal & bedtime) °iii. Acetaminophen (Tylenol, etc) 500-650mg four times a day (every meal & bedtime) °d. A  prescription for pain medication (such as oxycodone, hydrocodone, etc) should be given to you upon discharge.  Take your pain medication as prescribed.  °i. If you are having problems/concerns with the prescription medicine (does not control pain, nausea, vomiting, rash, itching, etc), please call us [phone] to see if we need to switch you to a different pain medicine that will work better for you and/or control your side effect better. °ii. If you need a refill on your pain medication, please contact your pharmacy.  They will contact our office to request authorization. Prescriptions will not be filled after 5 pm or on week-ends. ° °Use a Sitz Bath 4-8 times a day for relief ° ° °Sitz Bath °A sitz bath is a warm water bath taken in the sitting position that covers only the hips and buttocks. It may be used for either healing or hygiene purposes. Sitz baths are also used to relieve pain, itching, or muscle spasms. The water may contain medicine. Moist heat will help you heal and relax.  °HOME CARE INSTRUCTIONS  °Take 3 to 4 sitz baths a day. °1. Fill the bathtub   half full with warm water. °2. Sit in the water and open the drain a little. °3. Turn on the warm water to keep the tub half full. Keep the water running constantly. °4. Soak in the water for 15 to 20 minutes. °5. After the sitz bath, pat the affected area dry first. ° ° °4. KEEP YOUR BOWELS REGULAR °a. The goal is one bowel movement a day °b. Avoid getting constipated.  Between the surgery and the pain medications, it  is common to experience some constipation.  Increasing fluid intake and taking a fiber supplement (such as Metamucil, Citrucel, FiberCon, MiraLax, etc) 1-2 times a day regularly will usually help prevent this problem from occurring.  A mild laxative (prune juice, Milk of Magnesia, MiraLax, etc) should be taken according to package directions if there are no bowel movements after 48 hours. °c. Watch out for diarrhea.  If you have many loose bowel movements, simplify your diet to bland foods & liquids for a few days.  Stop any stool softeners and decrease your fiber supplement.  Switching to mild anti-diarrheal medications (Kayopectate, Pepto Bismol) can help.  If this worsens or does not improve, please call us. ° °5. Wound Care ° °a. Remove your bandages the day after surgery.  Unless discharge instructions indicate otherwise, leave your bandage dry and in place overnight.  Remove the bandage during your first bowel movement.   °b. Wear an absorbent pad or soft cotton gauze in your underwear as needed to catch any drainage and help keep the area  °c. Keep the area clean and dry.  Bathe / shower every day.  Keep the area clean by showering / bathing over the incision / wound.   It is okay to soak an open wound to help wash it.  Wet wipes or showers / gentle washing after bowel movements is often less traumatic than regular toilet paper. °d. You will often notice bleeding with bowel movements.  This should slow down by the end of the first week of surgery °e. Expect some drainage.  This should slow down, too, by the end of the first week of surgery.  Wear an absorbent pad or soft cotton gauze in your underwear until the drainage stops. ° °6. ACTIVITIES as tolerated:   °a. You may resume regular (light) daily activities beginning the next day--such as daily self-care, walking, climbing stairs--gradually increasing activities as tolerated.  If you can walk 30 minutes without difficulty, it is safe to try more intense  activity such as jogging, treadmill, bicycling, low-impact aerobics, swimming, etc. °b. Save the most intensive and strenuous activity for last such as sit-ups, heavy lifting, contact sports, etc  Refrain from any heavy lifting or straining until you are off narcotics for pain control.   °c. DO NOT PUSH THROUGH PAIN.  Let pain be your guide: If it hurts to do something, don't do it.  Pain is your body warning you to avoid that activity for another week until the pain goes down. °d. You may drive when you are no longer taking prescription pain medication, you can comfortably sit for long periods of time, and you can safely maneuver your car and apply brakes. °e. You may have sexual intercourse when it is comfortable.  °7. FOLLOW UP in our office °a. Please call [facility] at [phone] to set up an appointment to see your surgeon in the office for a follow-up appointment approximately 2 weeks after your surgery. °b. Make sure that you call for   this appointment the day you arrive home to insure a convenient appointment time. 10. IF YOU HAVE DISABILITY OR FAMILY LEAVE FORMS, BRING THEM TO THE OFFICE FOR PROCESSING.  DO NOT GIVE THEM TO YOUR DOCTOR.        WHEN TO CALL US [phone]: 1. Poor pain control 2. Reactions / problems with new medications (rash/itching, nausea, etc)  3. Fever over 101.5 F (38.5 C) 4. Inability to urinate 5. Nausea and/or vomiting 6. Worsening swelling or bruising 7. Continued bleeding from incision. 8. Increased pain, redness, or drainage from the incision  The clinic staff is available to answer your questions during regular business hours (8:30am-5pm).  Please dont hesitate to call and ask to speak to one of our nurses for clinical concerns.   A surgeon from [facility] is always on call at the hospitals   If you have a medical emergency, go to the nearest emergency room or call 911.    [facility] [address], [address],  [place], [place]  [id number] ? MAIN: [phone] ? TOLL FREE: [phone] ? FAX [phone] [email or web address]   Anal Fistula An anal fistula is an abnormal tunnel that develops between the bowel and the skin near the outside of the anus, where stool (feces) comes out. The anus has many tiny glands that make lubricating fluid. Sometimes, these glands become plugged and infected, and that can cause a fluid-filled pocket (abscess) to form. An anal fistula often develops after this infection or abscess. CAUSES In most cases, an anal fistula is caused by a past or current anal abscess. Other causes include:  A complication of surgery.  Trauma to the rectal area.  Radiation to the area.  Medical conditions or diseases, such as:  Chronic inflammatory bowel disease, such as Crohn disease or ulcerative colitis.  Colon cancer or rectal cancer.  Diverticular disease, such as diverticulitis.  An STD (sexually transmitted disease), such as gonorrhea, chlamydia, or syphilis.  An infection that is caused by HIV (human immunodeficiency virus).  Foreign body in the rectum. SYMPTOMS Symptoms of this condition include:  Throbbing or constant pain that may be worse while you are sitting.  Swelling or irritation around the anus.  Drainage of pus or blood from an opening near the anus.  Pain with bowel movements.  Fever or chills. DIAGNOSIS Your health care provider will examine the area to find the openings of the anal fistula and the fistula tract. The external opening of the anal fistula may be seen during a physical exam. You may also have tests, including:  An exam of the rectal area with a gloved hand (digital rectal exam).  An exam with a probe or scope to help locate the internal opening of the fistula.  Imaging tests to find the exact location and path of the fistula. These tests may include X-rays, an ultrasound, a CT scan, or MRI. The path is made visible by a dye  that is injected into the fistula opening. You may have other tests to find the cause of the anal fistula. TREATMENT The most common treatment for an anal fistula is surgery. The type of surgery that is used will depend on where the fistula is located and how complex the fistula is. Surgical options include:  A fistulotomy. The whole fistula is opened up, and the contents are drained to promote healing.  Seton placement. A silk string (seton) is placed into the fistula during a fistulotomy. This helps to  drain any infection to promote healing.  Advancement flap procedure. Tissue is removed from your rectum or the skin around the anus and is attached to the opening of the fistula.  Bioprosthetic plug. A cone-shaped plug is made from your tissue and is used to block the opening of the fistula. Some anal fistulas do not require surgery. A nonsurgical treatment option involves injecting a fibrin glue to seal the fistula. You also may be prescribed an antibiotic medicine to treat an infection. HOME CARE INSTRUCTIONS Medicines  Take over-the-counter and prescription medicines only as told by your health care provider.  If you were prescribed an antibiotic medicine, take it as told by your health care provider. Do not stop taking the antibiotic even if you start to feel better.  Use a stool softener or a laxative if told to do so by your health care provider. General Instructions  Eat a high-fiber diet as told by your health care provider. This can help to prevent constipation.  Drink enough fluid to keep your urine clear or pale yellow.  Take a warm sitz bath for 15-20 minutes, 3-4 times per day, or as told by your health care provider. Sitz baths can ease your pain and discomfort and help with healing.  Follow good hygiene to keep the anal area as clean and dry as possible. Use wet toilet paper or a moist towelette after each bowel movement.  Keep all follow-up visits as told by your health  care provider. This is important. SEEK MEDICAL CARE IF:  You have increased pain that is not controlled with medicines.  You have new redness or swelling around the anal area.  You have new fluid, blood, or pus coming from the anal area.  You have tenderness or warmth around the anal area. SEEK IMMEDIATE MEDICAL CARE IF:  You have a fever.  You have severe pain.  You have chills or diarrhea.  You have severe problems urinating or having a bowel movement.   This information is not intended to replace advice given to you by your health care provider. Make sure you discuss any questions you have with your health care provider.   Document Released: 04/20/2008 Document Revised: 01/27/2015 Document Reviewed: 08/03/2014 Elsevier Interactive Patient Education [employer]

## 2016-01-27 NOTE — Transfer of Care (Signed)
Immediate Anesthesia Transfer of Care Note  Patient: Roberto Mason  Procedure(s) Performed: Procedure(s): RECTAL EXAM UNDER ANESTHESIA (N/A) REPAIR OF PERIRECTAL FISTULA intersphincteric fistula (N/A)  Patient Location: PACU  Anesthesia Type:General  Level of Consciousness: responds to stimulation  Airway & Oxygen Therapy: Patient connected to face mask oxygen  Post-op Assessment: Report given to RN  Post vital signs: Reviewed and stable  Last Vitals:  Vitals:   01/27/16 0554  BP: 123/85  Pulse: 78  Resp: 16  Temp: 36.8 C    Last Pain:  Vitals:   01/27/16 0616  TempSrc:   PainSc: 1       Patients Stated Pain Goal: 2 (01/27/16 0616)  Complications: No apparent anesthesia complications

## 2016-01-27 NOTE — Anesthesia Procedure Notes (Signed)
Procedure Name: Intubation Date/Time: 01/27/2016 7:32 AM Performed by: Renaldo FiddlerHOM, Joby Hershkowitz EVETTE Pre-anesthesia Checklist: Patient identified Patient Re-evaluated:Patient Re-evaluated prior to inductionOxygen Delivery Method: Circle system utilized Preoxygenation: Pre-oxygenation with 100% oxygen Intubation Type: IV induction Ventilation: Mask ventilation without difficulty Laryngoscope Size: Mac and 4 Grade View: Grade I Tube type: Oral Tube size: 7.5 mm Number of attempts: 1 Airway Equipment and Method: Patient positioned with wedge pillow and Stylet Placement Confirmation: ETT inserted through vocal cords under direct vision,  positive ETCO2,  CO2 detector and breath sounds checked- equal and bilateral Secured at: 23 cm Tube secured with: Tape Dental Injury: Teeth and Oropharynx as per pre-operative assessment

## 2016-01-27 NOTE — Interval H&P Note (Signed)
History and Physical Interval Note:  01/27/2016 7:17 AM  Roberto Mason  has presented today for surgery, with the diagnosis of Perirectal fistula  The various methods of treatment have been discussed with the patient and family. After consideration of risks, benefits and other options for treatment, the patient has consented to  Procedure(s): RECTAL EXAM UNDER ANESTHESIA (N/A) REPAIR OF PERIRECTAL FISTULA (N/A) as a surgical intervention .  The patient's history has been reviewed, patient examined, no change in status, stable for surgery.  I have reviewed the patient's chart and labs.  Questions were answered to the patient's satisfaction.     Roberto Mallari C.

## 2016-01-27 NOTE — Anesthesia Postprocedure Evaluation (Signed)
Anesthesia Post Note  Patient: Roberto CannyDarren R Mason  Procedure(s) Performed: Procedure(s) (LRB): RECTAL EXAM UNDER ANESTHESIA (N/A) REPAIR OF PERIRECTAL FISTULA intersphincteric fistula (N/A)  Patient location during evaluation: PACU Anesthesia Type: General Level of consciousness: awake and alert Pain management: pain level controlled Vital Signs Assessment: post-procedure vital signs reviewed and stable Respiratory status: spontaneous breathing, nonlabored ventilation, respiratory function stable and patient connected to nasal cannula oxygen Cardiovascular status: blood pressure returned to baseline and stable Postop Assessment: no signs of nausea or vomiting Anesthetic complications: no    Last Vitals:  Vitals:   01/27/16 1045 01/27/16 1102  BP: 117/68 133/63  Pulse: 71 70  Resp: 18 18  Temp: 36.5 C 36.5 C    Last Pain:  Vitals:   01/27/16 1102  TempSrc: Oral  PainSc: 3                  Dacia Capers J

## 2016-01-27 NOTE — H&P (View-Only) (Signed)
Roberto Mason 01/03/2016 2:02 PM Location: [facility] Patient #: [id number] DOB: [birth year] Married / Language: English / Race: White Male    History of Present Illness Roberto Sportsman MD; 01/03/2016 2:30 PM) The patient is a 35 year old male who presents with a complaint of anal problems. Note for "Anal problems": Patient returns status post incision and drainage of left anterior perirectal abscess 09/22/2015  Patient found to have increasing pain and swelling in his perineum. Underwent emergent incision and drainage of 5 cm perianal abscess in the left anterior region 3 months ago. Initially seen to be doing well. However he's had persistent drainage and his return to clinic a few times. Referred to me given concern of development of anal fistula now. Patient returns. He still has persistent drainage near the anus. Less pain now. However still leaks on underwear. No fevers chills or sweats. He almost quit smoking but has back to doing that. Moving his bowels every day. On a fiber supplement. Occasional blood when he wipes. No fall or trauma.  No personal nor family history of GI/colon cancer, inflammatory bowel disease, irritable bowel syndrome, allergy such as Celiac Sprue, dietary/dairy problems, colitis, ulcers nor gastritis. No recent sick contacts/gastroenteritis. No travel outside the country. No changes in diet. No dysphagia to solids or liquids. No significant heartburn or reflux. No hematochezia, hematemesis, coffee ground emesis. No evidence of prior gastric/peptic ulceration.     Patient Name: Roberto Mason   Date of Surgery: 09/22/2015  Pre op Diagnosis: Perirectal abscess  Post op Diagnosis: Perirectal abscess, left anterior  Procedure: Diagnostic anoscopy Incision and drainage perianal abscess Obtain anaerobic  and aerobic cultures  Surgeon: Angelia Mould. Derrell Lolling, M.D., FACS  Assistant: OR staff  Operative Indications: This is a 35 year old man from [facility] who presented with fever and pain in his perineum. This has been going on for just a few days. No prior history. No prior gastrointestinal disorders. Examination reveals a 4-5 cm abscess in the left anterior position, really about at 1:00 position. This does not appear to involve the scrotum or the anal sphincters.  Operative Findings: Anoscopy was normal. The distal 10 inches of rectal mucosa were perfectly healthy and without inflammatory change. There is no internal drainage. There is no mass bulging into the lumen. Anal sphincters had normal tone and were easily dilated. Minimal hemorrhoids. No fissure or fistula seen There was a large abscess at about the 12:30 position starting just below the base of the scrotum and going down to the skin outside the anoderm. This appeared to be a unilocular abscess. Purulent material was brownish tan. Cultures were taken   Allergies Fay Records, CMA; 01/03/2016 2:02 PM) No Known Drug Allergies 10/06/2015  Medication History Fay Records, [place]; 01/03/2016 2:03 PM) No Current Medications Medications Reconciled    Vitals Fay Records CMA; 01/03/2016 2:03 PM) 01/03/2016 2:03 PM Weight: 217 lb Height: 72in Body Surface Area: 2.21 m Body Mass Index: 29.43 kg/m  Temp.: 66F(Temporal)  Pulse: 89 (Regular)  Resp.: 18 (Unlabored)  BP: 126/80 (Sitting, Left Arm, Standard)      Physical Exam Roberto Sportsman MD; 01/03/2016 2:27 PM)  General Mental Status-Alert. General Appearance-Not in acute distress. Voice-Normal.  Integumentary Global Assessment Upon inspection and palpation of skin surfaces of the - Distribution of scalp and body hair is normal. General Characteristics Overall examination of the  patient's skin reveals - no rashes and no suspicious lesions.  Head and Neck Head-normocephalic, atraumatic with no  lesions or palpable masses. Face Global Assessment - atraumatic, no absence of expression. Neck Global Assessment - no abnormal movements, no decreased range of motion. Trachea-midline. Thyroid Gland Characteristics - non-tender.  Eye Eyeball - Left-Extraocular movements intact, No Nystagmus. Eyeball - Right-Extraocular movements intact, No Nystagmus. Upper Eyelid - Left-No Cyanotic. Upper Eyelid - Right-No Cyanotic.  Chest and Lung Exam Inspection Accessory muscles - No use of accessory muscles in breathing.  Abdomen Note: Abdomen soft. Nontender, nondistended. No guarding. No umbilical no other hernias  Male Genitourinary Note: No inguinal hernias. Normal external genitalia. Epididymi, testes, and spermatic cords normal without any masses.  Rectal Note: Punctate sinus opening along left anterior midline raphae., 5 centimeters from the left anterior anal verge. Purulence expressed. Cord in SQ felt to probable small opening at anal verge. Suspicious for superficial perirectal/perineal fistula.  Perianal skin clean with good hygiene. No pruritis ani. No pilonidal disease. No fissure. Normal sphincter tone. Tolerates digital rectal exam. No rectal masses. Some increased purulent depression with anterior rectal wall palpation  Peripheral Vascular Upper Extremity Inspection - Left - Not Gangrenous, No Petechiae. Right - Not Gangrenous, No Petechiae.  Neurologic Neurologic evaluation reveals -normal attention span and ability to concentrate, able to name objects and repeat phrases. Appropriate fund of knowledge and normal coordination.  Neuropsychiatric Mental status exam performed with findings of-able to articulate well with normal speech/language, rate, volume and coherence and no evidence of hallucinations, delusions, obsessions or  homicidal/suicidal ideation. Orientation-oriented X3.  Musculoskeletal Global Assessment Gait and Station - normal gait and station.  Lymphatic General Lymphatics Description - No Generalized lymphadenopathy.    Assessment & Plan Roberto Mason(Yohanna Tow C. 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  I think this will not heal until it is more properly excise. I recommended examination under anesthesia. Most likely will be excision of tract/superficial fistulotomy. If it is intersphincteric, may require LIFT or other complex repair. Because it is anterior, plan lithotomy position. Could try back/local anesthetic versus general. Last time he needed General.  I strongly recommended he quit smoking to lower the chance of recurrence and healing problems  Current Plans The anatomy & physiology of the anorectal region was discussed. We discussed the pathophysiology of anorectal abscess and fistula. Differential diagnosis was discussed. Natural history progression was discussed. I stressed the importance of a bowel regimen to have daily soft bowel movements to minimize progression of disease.  The patient's condition is not adequately controlled. Non-operative treatment has not healed the fistula. Therefore, I recommended examination under anaesthesia to confirm the diagnosis and treat the fistula. I discussed techniques that may be required such as fistulotomy, ligation by LIFT technique, and/or seton placement. Benefits & alternatives discussed. I noted a good likelihood this will help address the problem, but sometimes repeat operations and prolonged healing times may occur. Risks such as bleeding, pain, recurrence, reoperation, incontinence, heart attack, death, and other risks were discussed.  Educational handouts further explaining the pathology, treatment options, and bowel regimen were given.  The patient expressed understanding & wishes to proceed. We will work to coordinate surgery for a mutually convenient time.  Pt Education - CCS Abscess/Fistula (AT): discussed with patient and provided information. ENCOUNTER FOR PREOPERATIVE EXAMINATION FOR GENERAL SURGICAL PROCEDURE (Z01.818)  Current Plans You are being scheduled for surgery - Our schedulers will call you.  You should hear from our office's scheduling department within 5 working days about the location, date,  and time of surgery. We try to make accommodations for patient's preferences in scheduling surgery, but sometimes the OR schedule or the surgeon's schedule prevents us from making those accommodations.  If you have not heard from our office [phone]) in 5 working days, call the office and ask for your surgeon's nurse.  If you have other questions about your diagnosis, plan, or surgery, call the office and ask for your surgeon's nurse.  Pt Education - CCS Rectal Prep for Anorectal outpatient/office surgery: discussed with patient and provided information. Pt Education - CCS Rectal Surgery HCI (Ranisha Allaire): discussed with patient and provided information. Pt Education - CCS Hemorrhoids (Liston Thum): discussed with patient and provided information. Pt Education - CCS Pelvic Floor Exercises (Kegels) and Dysfunction HCI (Maicey Barrientez)  STOP SMOKING! We talked to the patient about the dangers of smoking.  We stressed that tobacco use dramatically increases the risk of peri-operative complications such as infection, tissue necrosis leaving to problems with incision/wound and organ healing, hernia, chronic pain, heart attack, stroke, DVT, pulmonary embolism, and death.  We noted there are programs in our community to help stop smoking.  Information was available.   Roberto SportsmanSteven C. Arvo Ealy, M.D., F.A.C.S. Gastrointestinal and Minimally Invasive Surgery [facility] [address] [place], [place] [id number] [phone]  [phone] Main / Paging

## 2016-01-27 NOTE — Progress Notes (Signed)
No review required - OP procedure

## 2016-06-12 ENCOUNTER — Ambulatory Visit (INDEPENDENT_AMBULATORY_CARE_PROVIDER_SITE_OTHER): Payer: Commercial Managed Care - PPO | Admitting: Nurse Practitioner

## 2016-06-12 ENCOUNTER — Encounter: Payer: Self-pay | Admitting: Nurse Practitioner

## 2016-06-12 VITALS — BP 124/84 | HR 85 | Temp 98.9°F | Ht 72.0 in | Wt 209.0 lb

## 2016-06-12 DIAGNOSIS — Z716 Tobacco abuse counseling: Secondary | ICD-10-CM | POA: Diagnosis not present

## 2016-06-12 DIAGNOSIS — Z0000 Encounter for general adult medical examination without abnormal findings: Secondary | ICD-10-CM | POA: Diagnosis not present

## 2016-06-12 DIAGNOSIS — F17200 Nicotine dependence, unspecified, uncomplicated: Secondary | ICD-10-CM

## 2016-06-12 MED ORDER — VARENICLINE TARTRATE 0.5 MG X 11 & 1 MG X 42 PO MISC: ORAL | 0 refills | Status: DC

## 2016-06-12 NOTE — Patient Instructions (Addendum)
Go to basement for blood draw. You will need to be fasting at least 6-8hrs.  Call office to schedule follow up appt within 73month of starting chantix.  Varenicline oral tablets What is this medicine? VARENICLINE (var EN i kleen) is used to help people quit smoking. It can reduce the symptoms caused by stopping smoking. It is used with a patient support program recommended by your physician. This medicine may be used for other purposes; ask your health care provider or pharmacist if you have questions. COMMON BRAND NAME(S): Chantix What should I tell my health care provider before I take this medicine? They need to know if you have any of these conditions: -bipolar disorder, depression, schizophrenia or other mental illness -heart disease -if you often drink alcohol -kidney disease -peripheral vascular disease -seizures -stroke -suicidal thoughts, plans, or attempt; a previous suicide attempt by you or a family member -an unusual or allergic reaction to varenicline, other medicines, foods, dyes, or preservatives -pregnant or trying to get pregnant -breast-feeding How should I use this medicine? Take this medicine by mouth after eating. Take with a full glass of water. Follow the directions on the prescription label. Take your doses at regular intervals. Do not take your medicine more often than directed. There are 3 ways you can use this medicine to help you quit smoking; talk to your health care professional to decide which plan is right for you: 1) you can choose a quit date and start this medicine 1 week before the quit date, or, 2) you can start taking this medicine before you choose a quit date, and then pick a quit date between day 8 and 35 days of treatment, or, 3) if you are not sure that you are able or willing to quit smoking right away, start taking this medicine and slowly decrease the amount you smoke as directed by your health care professional with the goal of being  cigarette-free by week 12 of treatment. Stick to your plan; ask about support groups or other ways to help you remain cigarette-free. If you are motivated to quit smoking and did not succeed during a previous attempt with this medicine for reasons other than side effects, or if you returned to smoking after this treatment, speak with your health care professional about whether another course of this medicine may be right for you. A special MedGuide will be given to you by the pharmacist with each prescription and refill. Be sure to read this information carefully each time. Talk to your pediatrician regarding the use of this medicine in children. This medicine is not approved for use in children. Overdosage: If you think you have taken too much of this medicine contact a poison control center or emergency room at once. NOTE: This medicine is only for you. Do not share this medicine with others. What if I miss a dose? If you miss a dose, take it as soon as you can. If it is almost time for your next dose, take only that dose. Do not take double or extra doses. What may interact with this medicine? -alcohol or any product that contains alcohol -insulin -other stop smoking aids -theophylline -warfarin This list may not describe all possible interactions. Give your health care provider a list of all the medicines, herbs, non-prescription drugs, or dietary supplements you use. Also tell them if you smoke, drink alcohol, or use illegal drugs. Some items may interact with your medicine. What should I watch for while using this medicine? Visit your  doctor or health care professional for regular check ups. Ask for ongoing advice and encouragement from your doctor or healthcare professional, friends, and family to help you quit. If you smoke while on this medication, quit again Your mouth may get dry. Chewing sugarless gum or sucking hard candy, and drinking plenty of water may help. Contact your doctor if  the problem does not go away or is severe. You may get drowsy or dizzy. Do not drive, use machinery, or do anything that needs mental alertness until you know how this medicine affects you. Do not stand or sit up quickly, especially if you are an older patient. This reduces the risk of dizzy or fainting spells. Sleepwalking can happen during treatment with this medicine, and can sometimes lead to behavior that is harmful to you, other people, or property. Stop taking this medicine and tell your doctor if you start sleepwalking or have other unusual sleep-related activity. Decrease the amount of alcoholic beverages that you drink during treatment with this medicine until you know if this medicine affects your ability to tolerate alcohol. Some people have experienced increased drunkenness (intoxication), unusual or sometimes aggressive behavior, or no memory of things that have happened (amnesia) during treatment with this medicine. The use of this medicine may increase the chance of suicidal thoughts or actions. Pay special attention to how you are responding while on this medicine. Any worsening of mood, or thoughts of suicide or dying should be reported to your health care professional right away. What side effects may I notice from receiving this medicine? Side effects that you should report to your doctor or health care professional as soon as possible: -allergic reactions like skin rash, itching or hives, swelling of the face, lips, tongue, or throat -acting aggressive, being angry or violent, or acting on dangerous impulses -breathing problems -changes in vision -chest pain or chest tightness -confusion, trouble speaking or understanding -new or worsening depression, anxiety, or panic attacks -extreme increase in activity and talking (mania) -fast, irregular heartbeat -feeling faint or lightheaded, falls -fever -pain in legs when walking -problems with balance, talking, walking -redness,  blistering, peeling or loosening of the skin, including inside the mouth -ringing in ears -seeing or hearing things that aren't there (hallucinations) -seizures -sleepwalking -sudden numbness or weakness of the face, arm or leg -thoughts about suicide or dying, or attempts to commit suicide -trouble passing urine or change in the amount of urine -unusual bleeding or bruising -unusually weak or tired Side effects that usually do not require medical attention (report to your doctor or health care professional if they continue or are bothersome): -constipation -headache -nausea, vomiting -strange dreams -stomach gas -trouble sleeping This list may not describe all possible side effects. Call your doctor for medical advice about side effects. You may report side effects to FDA at 1-800-FDA-1088. Where should I keep my medicine? Keep out of the reach of children. Store at room temperature between 15 and 30 degrees C (59 and 86 degrees F). Throw away any unused medicine after the expiration date. NOTE: This sheet is a summary. It may not cover all possible information. If you have questions about this medicine, talk to your doctor, pharmacist, or health care provider.  2017 Elsevier/Gold Standard (2015-01-21 16:14:23)

## 2016-06-12 NOTE — Progress Notes (Signed)
Pre visit review using our clinic review tool, if applicable. No additional management support is needed unless otherwise documented below in the visit note. 

## 2016-06-12 NOTE — Progress Notes (Signed)
Subjective:    Patient ID: Roberto Mason, male    DOB: Apr 16, 1981, 36 y.o.   MRN: [id number]  Patient presents today for complete physical or establish care (new patient) and needs new chantix prescription.  HPI  Tobacco Use: states he lost prescription for chantix, hence still smoking. Hopes to stop tobacco use within the next 65month  Immunizations: (TDAP, Hep C screen, Pneumovax, Influenza, zoster)  Health Maintenance  Topic Date Due  . HIV Screening  01/20/2017*  . Flu Shot  01/22/2017*  . Tetanus Vaccine  01/17/2026  *Topic was postponed. The date shown is not the original due date.   Diet:regular Weight:  Wt Readings from Last 3 Encounters:  06/12/16 209 lb (94.8 kg)  01/27/16 208 lb (94.3 kg)  01/25/16 208 lb (94.3 kg)   Exercise:none No flowsheet data found. Home Safety:home with wife and children Depression/Suicide:denies No flowsheet data found. No flowsheet data found. Vision:needed Dental:up to date Advanced Directive: Advanced Directives 01/27/2016  Does Patient Have a Medical Advance Directive? No  Would patient like information on creating a medical advance directive? No - patient declined information   Sexual History (birth control, marital status, STD):married, sexually active  Medications and allergies reviewed with patient and updated if appropriate.  Patient Active Problem List   Diagnosis Date Noted  . Tobacco abuse 01/27/2016  . Intersphincteric perirectal fistula s/p LIFT repair 01/27/2016 01/18/2016    Current Outpatient Prescriptions on File Prior to Visit  Medication Sig Dispense Refill  . omeprazole (PRILOSEC) 40 MG capsule Take 40 mg by mouth daily.     No current facility-administered medications on file prior to visit.     Past Medical History:  Diagnosis Date  . Allergy   . Cold 01/25/2016   pt states x 2 weeks with lingering cough about 10x day-  no fever, nonproductive  . GERD (gastroesophageal reflux disease)   . PONV  (postoperative nausea and vomiting)   . 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 Tobacco abuse     Past Surgical History:  Procedure Laterality Date  . FISTULA PLUG N/A 01/27/2016   Procedure: REPAIR OF PERIRECTAL FISTULA intersphincteric fistula;  Surgeon: StMichael BostonMD;  Location: [facility];  Service: General;  Laterality: N/A;  . IRRIGATION AND DEBRIDEMENT ABSCESS N/A 09/22/2015   Procedure: anoscope IRRIGATION AND DEBRIDEMENT ABSCESS;  Surgeon: HaFanny SkatesMD;  Location: [facility];  Service: General;  Laterality: N/A;  . KNEE ARTHROSCOPY Right   . MOUTH SURGERY    . RECTAL EXAM UNDER ANESTHESIA N/A 01/27/2016   Procedure: RECTAL EXAM UNDER ANESTHESIA;  Surgeon: StMichael BostonMD;  Location: [facility];  Service: General;  Laterality: N/A;    Social History   Social History  . Marital status: Married    Spouse name: N/A  . Number of children: N/A  . Years of education: N/A   Social History Main Topics  . Smoking status: Current Every Day Smoker    Packs/day: 1.00    Types: Cigarettes  . Smokeless tobacco: Current User    Types: Chew     Comment: success with chnatix in past, quit for 1year  . Alcohol use No  . Drug use: No  . Sexual activity: Yes   Other Topics Concern  . None   Social History Narrative  . None    Family History  Problem Relation Age of Onset  . Glaucoma Mother   . Heart disease Father   .  Hyperlipidemia Father   . Hypertension Father   . Heart failure Father   . Diabetes Maternal Grandmother   . Diabetes Maternal Grandfather   . Diabetes Paternal Grandmother   . Diabetes Paternal Grandfather   . COPD Paternal Grandfather   . Lung cancer Paternal Grandfather         Review of Systems  Constitutional: Negative for fever, malaise/fatigue and weight loss.  HENT: Negative for congestion and sore throat.   Eyes:       Negative for visual changes  Respiratory: Negative for cough and shortness of breath.   Cardiovascular:  Negative for chest pain, palpitations and leg swelling.  Gastrointestinal: Negative for abdominal pain, blood in stool, constipation, diarrhea, heartburn, melena, nausea and vomiting.       Irregular BMs at this time due to recent fistula repair surgery. Use of increased fiber and oral fluids to maintain regularity. Has some improvement.  Genitourinary: Negative for dysuria, frequency and urgency.  Musculoskeletal: Negative for falls, joint pain and myalgias.  Skin: Negative for rash.  Neurological: Negative for dizziness, sensory change and headaches.  Endo/Heme/Allergies: Does not bruise/bleed easily.  Psychiatric/Behavioral: Negative for depression, substance abuse and suicidal ideas. The patient is not nervous/anxious.     Objective:   Vitals:   06/12/16 1344  BP: 124/84  Pulse: 85  Temp: 98.9 F (37.2 C)    Body mass index is 28.35 kg/m.   Physical Examination:  Physical Exam  Constitutional: He is oriented to person, place, and time and well-developed, well-nourished, and in no distress. No distress.  HENT:  Right Ear: External ear normal.  Left Ear: External ear normal.  Nose: Nose normal.  Mouth/Throat: Oropharynx is clear and moist. No oropharyngeal exudate.  Eyes: Conjunctivae and EOM are normal. Pupils are equal, round, and reactive to light. No scleral icterus.  Neck: Normal range of motion. Neck supple. No thyromegaly present.  Cardiovascular: Normal rate, normal heart sounds and intact distal pulses.   Pulmonary/Chest: Effort normal and breath sounds normal. He exhibits no tenderness.  Abdominal: Soft. Bowel sounds are normal. He exhibits no distension. There is no tenderness.  Musculoskeletal: Normal range of motion. He exhibits no edema or tenderness.  Lymphadenopathy:    He has no cervical adenopathy.  Neurological: He is alert and oriented to person, place, and time. Gait normal.  Skin: Skin is warm and dry.  Psychiatric: Affect and judgment normal.     ASSESSMENT and PLAN:  [name] was seen today for follow-up.  Diagnoses and all orders for this visit:  Preventative health care -     CBC w/Diff; Future -     Comp Met (CMET); Future -     Lipid panel; Future -     TSH; Future -     Hemoglobin A1c; Future  Encounter for tobacco use cessation counseling -     varenicline (CHANTIX PAK) 0.5 MG X 11 & 1 MG X 42 tablet; Take one 0.5 mg tablet by mouth once daily for 3 days, then increase to one 0.5 mg tablet twice daily for 4 days, then increase to one 1 mg tablet twice daily.  Tobacco use disorder -     varenicline (CHANTIX PAK) 0.5 MG X 11 & 1 MG X 42 tablet; Take one 0.5 mg tablet by mouth once daily for 3 days, then increase to one 0.5 mg tablet twice daily for 4 days, then increase to one 1 mg tablet twice daily.    No problem-specific Assessment &  Plan notes found for this encounter.     Follow up: Return in about 3 months (around 09/10/2016), or if symptoms worsen or fail to improve, for tobacco cessation (chantix use).Wilfred Lacy, NP

## 2016-06-15 ENCOUNTER — Telehealth: Payer: Self-pay | Admitting: *Deleted

## 2016-06-15 DIAGNOSIS — F17200 Nicotine dependence, unspecified, uncomplicated: Secondary | ICD-10-CM

## 2016-06-15 NOTE — Telephone Encounter (Signed)
PA started for Chantix Pak. Key : [id number]. Waiting for them to fax over the result.   Alternative for this med are: Bupropion HCL ER, Nicotine and Nicotine Polacrilex.

## 2016-06-15 NOTE — Telephone Encounter (Signed)
Never receive anything requesting for PA. Claris GowerCharlotte do you want me to start PA or you want to try something else? Please advise.

## 2016-06-15 NOTE — Telephone Encounter (Signed)
Please start a PA.

## 2016-06-15 NOTE — Telephone Encounter (Signed)
Pharmacy left msg on triage stating checking status on PA that was faxed over on pt Chantix.Per chart no PA has been started will f/u w/nurse to see if she received,../lmb

## 2016-06-16 NOTE — Telephone Encounter (Signed)
Left detail massage explain to pt about PA denied, and asking to see if he will try zyban. Waiting for pt to call back

## 2016-06-16 NOTE — Telephone Encounter (Signed)
Received fax from [facility], denied PA for Chantix. Please advise, there is an alternative

## 2016-06-16 NOTE — Telephone Encounter (Signed)
zyban was recommended. Let me know if patient will like to use this.

## 2016-06-19 NOTE — Telephone Encounter (Signed)
Rec'd call from pt this am he states he will try the Zyban. Inform pt charlotte is out of the office today maybe tomorrow before rx will be sent...[name]/lmb

## 2016-06-20 MED ORDER — BUPROPION HCL ER (SMOKING DET) 150 MG PO TB12: ORAL_TABLET | ORAL | 2 refills | Status: DC

## 2016-06-20 NOTE — Telephone Encounter (Signed)
Notified pt w/Roberto Mason response. He states once he start he will call bck to make the appt...[name]/lmb

## 2016-11-16 ENCOUNTER — Encounter (HOSPITAL_BASED_OUTPATIENT_CLINIC_OR_DEPARTMENT_OTHER): Payer: Self-pay | Admitting: *Deleted

## 2016-11-16 ENCOUNTER — Emergency Department (HOSPITAL_BASED_OUTPATIENT_CLINIC_OR_DEPARTMENT_OTHER)
Admission: EM | Admit: 2016-11-16 | Discharge: 2016-11-17 | Disposition: A | Discharge status: Home / Self Care | Payer: Commercial Managed Care - PPO | Attending: Emergency Medicine | Admitting: Emergency Medicine

## 2016-11-16 DIAGNOSIS — Z5321 Procedure and treatment not carried out due to patient leaving prior to being seen by health care provider: Secondary | ICD-10-CM | POA: Diagnosis not present

## 2016-11-16 DIAGNOSIS — R2242 Localized swelling, mass and lump, left lower limb: Secondary | ICD-10-CM | POA: Diagnosis not present

## 2016-11-16 NOTE — ED Notes (Signed)
Called x 2. No answer. [facility] [employer], no answer

## 2016-11-16 NOTE — ED Notes (Signed)
Called x1. No answer.

## 2016-11-16 NOTE — ED Triage Notes (Signed)
Pt with what he thinks is a spider bite to left lower leg presents with redness and mild swelling  Denies fevers or N/V

## 2017-01-26 ENCOUNTER — Ambulatory Visit (INDEPENDENT_AMBULATORY_CARE_PROVIDER_SITE_OTHER): Payer: Self-pay | Admitting: Family Medicine

## 2017-01-26 ENCOUNTER — Encounter: Payer: Self-pay | Admitting: Family Medicine

## 2017-01-26 VITALS — BP 124/60 | HR 74 | Temp 98.1°F | Ht 72.0 in | Wt 211.0 lb

## 2017-01-26 DIAGNOSIS — S6010XA Contusion of unspecified finger with damage to nail, initial encounter: Secondary | ICD-10-CM

## 2017-01-26 DIAGNOSIS — R21 Rash and other nonspecific skin eruption: Secondary | ICD-10-CM | POA: Diagnosis not present

## 2017-01-26 NOTE — Assessment & Plan Note (Signed)
Does not appear to have his nail bed involvement. - performed nail trephination - xrays - Advised to follow-up with no improvement. May need antibiotics.

## 2017-01-26 NOTE — Patient Instructions (Signed)
Thank you for coming in,   Please follow up with me if your nail bed worsens   Please feel free to call with any questions or concerns at any time, at [phone]. --Dr. Jordan LikesSchmitz

## 2017-01-26 NOTE — Progress Notes (Addendum)
Roberto Mason - 36 y.o. male MRN [id number]  Date of birth: 03/04/1981  SUBJECTIVE:  Including CC & ROS.  Chief Complaint  Patient presents with  . Hand Pain    Injury happened on Tuesday. Patient dropped a 2x12x12 on his pinky finger on the left hand. The finger is blue under the nail bed and swollen on the distal portion of the finger.   . Insect Bite    Possible insect bite. bites started around april. newest spot in the left leg above knee. the area is raised and red but is not hot to the touch.     [name] a 36 year old male that is presenting with a left fifth digit subungual hematoma and rash. He was lifting lumber on Wednesday night and smashed his finger on the bed with some wood. Since that time he has had bruising under the nail. He has had swelling of the distal aspect of his pinky finger. He reports her some improvement of the pain and the swelling has improved as well. He has not tried to drain this. He denies any numbness or tingling.  He also reports a rash that has been occurring since April. He has different stages of lesions on his legs. They're only occurring from his sock line to his shorts line. 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 He denies any other symptoms associated with them. They do not itch and they're not painful. They are a different stages with some of them being hyperpigmented and others being more acute. There is some clear drainage from it. He denies any fevers or chills.     Review of Systems  Constitutional: Negative for fever.  Musculoskeletal: Positive for joint swelling.  Skin: Positive for rash.  Neurological: Negative for weakness and numbness.  Hematological: Negative for adenopathy.    HISTORY: Past Medical, Surgical, Social, and Family History Reviewed & Updated per EMR.   Pertinent Historical Findings include:  Past Medical History:  Diagnosis Date  . Allergy   . Cold 01/25/2016   pt states x 2 weeks with lingering cough  about 10x day-  no fever, nonproductive  . GERD (gastroesophageal reflux disease)   . PONV (postoperative nausea and vomiting)   . 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 Tobacco abuse     Past Surgical History:  Procedure Laterality Date  . FISTULA PLUG N/A 01/27/2016   Procedure: REPAIR OF PERIRECTAL FISTULA intersphincteric fistula;  Surgeon: Karie SodaSteven Gross, MD;  Location: [facility];  Service: General;  Laterality: N/A;  . IRRIGATION AND DEBRIDEMENT ABSCESS N/A 09/22/2015   Procedure: anoscope IRRIGATION AND DEBRIDEMENT ABSCESS;  Surgeon: Claud KelpHaywood Ingram, MD;  Location: [facility];  Service: General;  Laterality: N/A;  . KNEE ARTHROSCOPY Right   . MOUTH SURGERY    . RECTAL EXAM UNDER ANESTHESIA N/A 01/27/2016   Procedure: RECTAL EXAM UNDER ANESTHESIA;  Surgeon: Karie SodaSteven Gross, MD;  Location: [facility];  Service: General;  Laterality: N/A;    Allergies  Allergen Reactions  . Oxycodone Nausea Only    Family History  Problem Relation Age of Onset  . Glaucoma Mother   . Heart disease Father   . Hyperlipidemia Father   . Hypertension Father   . Heart failure Father   . Diabetes Maternal Grandmother   . Diabetes Maternal Grandfather   . Diabetes Paternal Grandmother   . Diabetes Paternal Grandfather   . COPD Paternal Grandfather   . Lung cancer Paternal Grandfather  Social History   Social History  . Marital status: Married    Spouse name: N/A  . Number of children: N/A  . Years of education: N/A   Occupational History  . Not on file.   Social History Main Topics  . Smoking status: Current Every Day Smoker    Packs/day: 1.00    Types: Cigarettes  . Smokeless tobacco: Current User    Types: Chew     Comment: success with chnatix in past, quit for 1year  . Alcohol use No  . Drug use: No  . Sexual activity: Yes   Other Topics Concern  . Not on file   Social History Narrative  . No narrative on file     PHYSICAL EXAM:  VS: BP 124/60 (BP  Location: Left Arm, Patient Position: Sitting, Cuff Size: Large)   Pulse 74   Temp 98.1 F (36.7 C) (Oral)   Ht 6' (1.829 m)   Wt 211 lb (95.7 kg)   SpO2 98%   BMI 28.62 kg/m  Physical Exam Gen: NAD, alert, cooperative with exam, well-appearing ENT: normal lips, normal nasal mucosa,  Eye: normal EOM, normal conjunctiva and lids CV:  no edema, +2 pedal pulses   Resp: no accessory muscle use, non-labored,  Skin: Different stages of circular lesions within his lower extremity. There is hyperpigmentation as well as other areas with an ulcer center with red ring around it. No active draining. no areas of induration  Neuro: normal tone, normal sensation to touch Psych:  normal insight, alert and oriented MSK:  Left fifth digit. Obvious swelling of the DIP joint. Subungual hematoma is present. Normal flexion and extension of the distal phalanx. No open wound. Sensation is intact. Capillary refills brisk Neurovascular intact      The 5th digit was sterilized with alcohol. An 18 gauge needle was then inserted into the nail in three different areas in a perpendicular fashion.   ASSESSMENT & PLAN:   Subungual hematoma of finger of left hand Does not appear to have his nail bed involvement. - performed nail trephination - xrays - Advised to follow-up with no improvement. May need antibiotics.  Rash Unclear as to origin of this. Only occurring in his legs and between his sock line and shorts. Doesn't appear to be a bug bite. Has tried Neosporin previously with no improvement - He has a referral to dermatology already in place. Follow-up as needed

## 2017-01-26 NOTE — Assessment & Plan Note (Signed)
Unclear as to origin of this. Only occurring in his legs and between his sock line and shorts. Doesn't appear to be a bug bite. Has tried Neosporin previously with no improvement - He has a referral to dermatology already in place. Follow-up as needed

## 2017-04-05 ENCOUNTER — Ambulatory Visit: Payer: PRIVATE HEALTH INSURANCE | Admitting: Family Medicine

## 2017-04-05 ENCOUNTER — Other Ambulatory Visit: Payer: PRIVATE HEALTH INSURANCE

## 2017-04-05 ENCOUNTER — Encounter: Payer: Self-pay | Admitting: Family Medicine

## 2017-04-05 VITALS — BP 150/80 | HR 104 | Temp 98.3°F | Resp 16 | Wt 211.0 lb

## 2017-04-05 DIAGNOSIS — Z7721 Contact with and (suspected) exposure to potentially hazardous body fluids: Secondary | ICD-10-CM | POA: Insufficient documentation

## 2017-04-05 DIAGNOSIS — F5102 Adjustment insomnia: Secondary | ICD-10-CM | POA: Insufficient documentation

## 2017-04-05 DIAGNOSIS — R030 Elevated blood-pressure reading, without diagnosis of hypertension: Secondary | ICD-10-CM

## 2017-04-05 MED ORDER — TRAZODONE HCL 50 MG PO TABS: 25.0000 mg | ORAL_TABLET | Freq: Every evening | ORAL | 1 refills | Status: DC | PRN

## 2017-04-05 NOTE — Progress Notes (Addendum)
   Subjective:    Patient ID: Roberto Mason, male    DOB: 04/28/1981, 36 y.o.   MRN: [id number]  HPI recently became aware of wife's infidelity and requests std testing. They have a 38yo and a 38 yo. They have sought marriage counseling.  He is having no symptoms. Has no ho htn. bp is usually in the 120's.     Review of Systems  Constitutional: Negative for activity change, appetite change, chills, fatigue, fever and unexpected weight change.  HENT: Negative.   Eyes: Negative.   Respiratory: Negative.   Cardiovascular: Negative.   Gastrointestinal: Negative.   Genitourinary: Negative for discharge, dysuria, frequency, genital sores, hematuria, penile pain and testicular pain.  Musculoskeletal: Negative for arthralgias and myalgias.  Skin: Negative.   Neurological: Negative for weakness, numbness and headaches.  Psychiatric/Behavioral: Positive for sleep disturbance. Negative for agitation, behavioral problems, dysphoric mood, self-injury and suicidal ideas. The patient is not nervous/anxious.        Objective:   Physical Exam  Constitutional: He is oriented to person, place, and time. He appears well-developed and well-nourished. No distress.  HENT:  Head: Normocephalic and atraumatic.  Right Ear: External ear normal.  Left Ear: External ear normal.  Mouth/Throat: Oropharynx is clear and moist. No oropharyngeal exudate.  Eyes: Conjunctivae are normal. Pupils are equal, round, and reactive to light. Right eye exhibits no discharge. Left eye exhibits no discharge. No scleral icterus.  Neck: Neck supple. No JVD present. No tracheal deviation present. No thyromegaly present.  Cardiovascular: Normal rate, regular rhythm and normal heart sounds.  Pulmonary/Chest: Effort normal and breath sounds normal. No stridor.  Abdominal: Bowel sounds are normal. He exhibits no distension. There is no tenderness. There is no rebound and no guarding. Hernia confirmed negative in the right inguinal area  and confirmed negative in the left inguinal area.  Genitourinary: Penis normal.    Right testis shows no mass and no tenderness. Left testis shows no mass and no tenderness. No penile erythema or penile tenderness. No discharge found.  Lymphadenopathy:    He has no cervical adenopathy.       Right: No inguinal adenopathy present.       Left: No inguinal adenopathy present.  Neurological: He is alert and oriented to person, place, and time.  Skin: Skin is warm and dry. No rash noted. He is not diaphoretic.  Psychiatric: He has a normal mood and affect. His behavior is normal.      Pmh, soc and fh reviewed.    Assessment & Plan:

## 2017-04-06 LAB — HEPATITIS B CORE ANTIBODY, TOTAL: Hep B Core Total Ab: NONREACTIVE

## 2017-04-06 LAB — HEPATITIS B SURFACE ANTIBODY,QUALITATIVE: Hep B S Ab: REACTIVE — AB

## 2017-04-06 LAB — RPR: RPR Ser Ql: NONREACTIVE

## 2017-04-10 ENCOUNTER — Telehealth: Payer: Self-pay | Admitting: Nurse Practitioner

## 2017-04-10 ENCOUNTER — Encounter: Payer: Self-pay | Admitting: Nurse Practitioner

## 2017-04-10 ENCOUNTER — Other Ambulatory Visit: Payer: Self-pay

## 2017-04-10 DIAGNOSIS — Z7721 Contact with and (suspected) exposure to potentially hazardous body fluids: Secondary | ICD-10-CM

## 2017-04-10 NOTE — Telephone Encounter (Signed)
Left voicemail for patient to call the office back.  HIV and GC/Chylamdia need to be re-collected. Orders are in - lab appt needs to be scheduled.

## 2017-04-10 NOTE — Telephone Encounter (Signed)
Pt called for his results from 11/15, please call back in regard

## 2017-04-11 ENCOUNTER — Other Ambulatory Visit (HOSPITAL_COMMUNITY)
Admission: RE | Admit: 2017-04-11 | Discharge: 2017-04-11 | Disposition: A | Discharge status: Home / Self Care | Payer: PRIVATE HEALTH INSURANCE | Source: Ambulatory Visit | Attending: Family Medicine | Admitting: Family Medicine

## 2017-04-11 ENCOUNTER — Other Ambulatory Visit: Payer: PRIVATE HEALTH INSURANCE

## 2017-04-11 DIAGNOSIS — Z7721 Contact with and (suspected) exposure to potentially hazardous body fluids: Secondary | ICD-10-CM | POA: Insufficient documentation

## 2017-04-11 NOTE — Addendum Note (Signed)
Addended by: Andrez GrimeKREMER, Gitty Osterlund A on: 04/11/2017 08:58 AM   Modules accepted: Orders

## 2017-04-12 LAB — HIV ANTIBODY (ROUTINE TESTING W REFLEX): HIV 1&2 Ab, 4th Generation: NONREACTIVE

## 2017-04-13 LAB — URINE CYTOLOGY ANCILLARY ONLY
Chlamydia: NEGATIVE
Neisseria Gonorrhea: NEGATIVE

## 2017-11-15 ENCOUNTER — Ambulatory Visit (INDEPENDENT_AMBULATORY_CARE_PROVIDER_SITE_OTHER): Payer: PRIVATE HEALTH INSURANCE | Admitting: Family Medicine

## 2017-11-15 ENCOUNTER — Encounter: Payer: Self-pay | Admitting: Family Medicine

## 2017-11-15 VITALS — BP 142/78 | HR 97 | Ht 72.0 in | Wt 204.0 lb

## 2017-11-15 DIAGNOSIS — M545 Low back pain, unspecified: Secondary | ICD-10-CM

## 2017-11-15 MED ORDER — NAPROXEN-ESOMEPRAZOLE 500-20 MG PO TBEC: 1.0000 | DELAYED_RELEASE_TABLET | Freq: Two times a day (BID) | ORAL | 3 refills | Status: DC

## 2017-11-15 NOTE — Progress Notes (Signed)
Roberto Mason - 37 y.o. male MRN [id number]  Date of birth: Sep 27, 1980  SUBJECTIVE:  Including CC & ROS.  Chief Complaint  Patient presents with  . Back Pain    Roberto Mason is a 37 y.o. male that is presenting with back pain. Pain has been increasing for the past six months. Pain is located in his lower back. Denies tingling or numbness. Denies surgery. He sustained an injury in high school playing football. Standing worsens the pain. He has been taking Aleve for the pain.   Review of the MRI lumbar spine from 2013 shows transitional appearance that is labeled as the S1 vertebral body.  Shows a rudimentary disc at the S1-2 level.  Scoliosis and congenital narrowed spinal canal with superimposed degenerative changes and disc protrusions most prominent at L4-5 and L5-S1.   Review of Systems  Constitutional: Negative for fever.  HENT: Negative for congestion.   Respiratory: Negative for cough.   Cardiovascular: Negative for chest pain.  Gastrointestinal: Negative for abdominal pain.  Musculoskeletal: Positive for back pain.  Skin: Negative for color change.  Neurological: Negative for weakness.  Hematological: Negative for adenopathy.  Psychiatric/Behavioral: Negative for agitation.    HISTORY: Past Medical, Surgical, Social, and Family History Reviewed & Updated per EMR.   Pertinent Historical Findings include:  Past Medical History:  Diagnosis Date  . Allergy   . Cold 01/25/2016   pt states x 2 weeks with lingering cough about 10x day-  no fever, nonproductive  . GERD (gastroesophageal reflux disease)   . PONV (postoperative nausea and vomiting)   . 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 Tobacco abuse     Past Surgical History:  Procedure Laterality Date  . FISTULA PLUG N/A 01/27/2016   Procedure: REPAIR OF PERIRECTAL FISTULA intersphincteric fistula;  Surgeon: Karie Soda, MD;  Location: [facility];  Service: General;  Laterality: N/A;  .  IRRIGATION AND DEBRIDEMENT ABSCESS N/A 09/22/2015   Procedure: anoscope IRRIGATION AND DEBRIDEMENT ABSCESS;  Surgeon: Claud Kelp, MD;  Location: [facility];  Service: General;  Laterality: N/A;  . KNEE ARTHROSCOPY Right   . MOUTH SURGERY    . RECTAL EXAM UNDER ANESTHESIA N/A 01/27/2016   Procedure: RECTAL EXAM UNDER ANESTHESIA;  Surgeon: Karie Soda, MD;  Location: [facility];  Service: General;  Laterality: N/A;    Allergies  Allergen Reactions  . Oxycodone Nausea Only    Family History  Problem Relation Age of Onset  . Glaucoma Mother   . Heart disease Father   . Hyperlipidemia Father   . Hypertension Father   . Heart failure Father   . Diabetes Maternal Grandmother   . Diabetes Maternal Grandfather   . Diabetes Paternal Grandmother   . Diabetes Paternal Grandfather   . COPD Paternal Grandfather   . Lung cancer Paternal Grandfather      Social History   Socioeconomic History  . Marital status: Married    Spouse name: Not on file  . Number of children: Not on file  . Years of education: Not on file  . Highest education level: Not on file  Occupational History  . Not on file  Social Needs  . Financial resource strain: Not on file  . Food insecurity:    Worry: Not on file    Inability: Not on file  . Transportation needs:    Medical: Not on file    Non-medical: Not on file  Tobacco Use  . Smoking status:  Current Every Day Smoker    Packs/day: 1.00    Types: Cigarettes  . Smokeless tobacco: Current User    Types: Chew  . Tobacco comment: success with chnatix in past, quit for 1year  Substance and Sexual Activity  . Alcohol use: No  . Drug use: No  . Sexual activity: Yes  Lifestyle  . Physical activity:    Days per week: Not on file    Minutes per session: Not on file  . Stress: Not on file  Relationships  . Social connections:    Talks on phone: Not on file    Gets together: Not on file    Attends religious service: Not on file    Active member of club or  organization: Not on file    Attends meetings of clubs or organizations: Not on file    Relationship status: Not on file  . Intimate partner violence:    Fear of current or ex partner: Not on file    Emotionally abused: Not on file    Physically abused: Not on file    Forced sexual activity: Not on file  Other Topics Concern  . Not on file  Social History Narrative  . Not on file     PHYSICAL EXAM:  VS: BP (!) 142/78 (BP Location: Left Arm, Patient Position: Sitting, Cuff Size: Normal)   Pulse 97   Ht 6' (1.829 m)   Wt 204 lb (92.5 kg)   SpO2 97%   BMI 27.67 kg/m  Physical Exam Gen: NAD, alert, cooperative with exam, well-appearing ENT: normal lips, normal nasal mucosa,  Eye: normal EOM, normal conjunctiva and lids CV:  no edema, +2 pedal pulses   Resp: no accessory muscle use, non-labored,  Skin: no rashes, no areas of induration  Neuro: normal tone, normal sensation to touch Psych:  normal insight, alert and oriented MSK:  Back Exam:  Inspection: Unremarkable  Palpable tenderness: None. Leg strength: Quad: 5/5 Hamstring: 5/5 Hip flexor: 5/5   Strength at foot: Plantar-flexion: 5/5 Dorsi-flexion: 5/5 Eversion: 5/5 Inversion: 5/5  Gait unremarkable. SLR laying: Negative  XSLR laying: Negative  Neurovascularly intact       ASSESSMENT & PLAN:   Acute right-sided low back pain without sciatica Pain seems to be muscular. He has normal variants on imaging with a rudimentary vertebral body. Likely is not able to compensate and has the result of pain  - vimovo  - counseled on HEP  - if no improvement consider imaging and PT.

## 2017-11-15 NOTE — Patient Instructions (Signed)
Nice to meet you  Please try the exercises  Please try heat or ice  Please take the medication 7 days straight and then as needed.  Please follow up with me in 4 weeks.

## 2017-11-16 DIAGNOSIS — M545 Low back pain, unspecified: Secondary | ICD-10-CM | POA: Insufficient documentation

## 2017-11-16 NOTE — Assessment & Plan Note (Signed)
Pain seems to be muscular. He has normal variants on imaging with a rudimentary vertebral body. Likely is not able to compensate and has the result of pain  - vimovo  - counseled on HEP  - if no improvement consider imaging and PT.

## 2017-12-20 ENCOUNTER — Ambulatory Visit: Payer: PRIVATE HEALTH INSURANCE | Admitting: Family Medicine

## 2017-12-20 DIAGNOSIS — Z0289 Encounter for other administrative examinations: Secondary | ICD-10-CM

## 2017-12-20 NOTE — Progress Notes (Deleted)
Roberto CannyDarren R Mason - 37 y.o. male MRN [id number]  Date of birth: 10/31/1980  SUBJECTIVE:  Including CC & ROS.  No chief complaint on file.   Roberto CannyDarren R Mason is a 37 y.o. male that is  ***.  ***   Review of Systems  HISTORY: Past Medical, Surgical, Social, and Family History Reviewed & Updated per EMR.   Pertinent Historical Findings include:  Past Medical History:  Diagnosis Date  . Allergy   . Cold 01/25/2016   pt states x 2 weeks with lingering cough about 10x day-  no fever, nonproductive  . GERD (gastroesophageal reflux disease)   . PONV (postoperative nausea and vomiting)   . 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 Tobacco abuse     Past Surgical History:  Procedure Laterality Date  . FISTULA PLUG N/A 01/27/2016   Procedure: REPAIR OF PERIRECTAL FISTULA intersphincteric fistula;  Surgeon: Karie SodaSteven Gross, MD;  Location: [facility];  Service: General;  Laterality: N/A;  . IRRIGATION AND DEBRIDEMENT ABSCESS N/A 09/22/2015   Procedure: anoscope IRRIGATION AND DEBRIDEMENT ABSCESS;  Surgeon: Claud KelpHaywood Ingram, MD;  Location: [facility];  Service: General;  Laterality: N/A;  . KNEE ARTHROSCOPY Right   . MOUTH SURGERY    . RECTAL EXAM UNDER ANESTHESIA N/A 01/27/2016   Procedure: RECTAL EXAM UNDER ANESTHESIA;  Surgeon: Karie SodaSteven Gross, MD;  Location: [facility];  Service: General;  Laterality: N/A;    Allergies  Allergen Reactions  . Oxycodone Nausea Only    Family History  Problem Relation Age of Onset  . Glaucoma Mother   . Heart disease Father   . Hyperlipidemia Father   . Hypertension Father   . Heart failure Father   . Diabetes Maternal Grandmother   . Diabetes Maternal Grandfather   . Diabetes Paternal Grandmother   . Diabetes Paternal Grandfather   . COPD Paternal Grandfather   . Lung cancer Paternal Grandfather      Social History   Socioeconomic History  . Marital status: Married    Spouse name: Not on file  . Number of children: Not on file  . Years  of education: Not on file  . Highest education level: Not on file  Occupational History  . Not on file  Social Needs  . Financial resource strain: Not on file  . Food insecurity:    Worry: Not on file    Inability: Not on file  . Transportation needs:    Medical: Not on file    Non-medical: Not on file  Tobacco Use  . Smoking status: Current Every Day Smoker    Packs/day: 1.00    Types: Cigarettes  . Smokeless tobacco: Current User    Types: Chew  . Tobacco comment: success with chnatix in past, quit for 1year  Substance and Sexual Activity  . Alcohol use: No  . Drug use: No  . Sexual activity: Yes  Lifestyle  . Physical activity:    Days per week: Not on file    Minutes per session: Not on file  . Stress: Not on file  Relationships  . Social connections:    Talks on phone: Not on file    Gets together: Not on file    Attends religious service: Not on file    Active member of club or organization: Not on file    Attends meetings of clubs or organizations: Not on file    Relationship status: Not on file  . Intimate partner violence:  Fear of current or ex partner: Not on file    Emotionally abused: Not on file    Physically abused: Not on file    Forced sexual activity: Not on file  Other Topics Concern  . Not on file  Social History Narrative  . Not on file     PHYSICAL EXAM:  VS: There were no vitals taken for this visit. Physical Exam Gen: NAD, alert, cooperative with exam, well-appearing ENT: normal lips, normal nasal mucosa,  Eye: normal EOM, normal conjunctiva and lids CV:  no edema, +2 pedal pulses   Resp: no accessory muscle use, non-labored,  GI: no masses or tenderness, no hernia  Skin: no rashes, no areas of induration  Neuro: normal tone, normal sensation to touch Psych:  normal insight, alert and oriented MSK:  ***      ASSESSMENT & PLAN:   No problem-specific Assessment & Plan notes found for this encounter.

## 2018-09-05 ENCOUNTER — Telehealth: Payer: Self-pay | Admitting: Nurse Practitioner

## 2018-09-05 NOTE — Telephone Encounter (Signed)
I called patient and left message on patient voicemail per Alysia Penna to call office and schedule for a physical.

## 2018-10-02 ENCOUNTER — Ambulatory Visit: Payer: Self-pay

## 2018-10-02 NOTE — Telephone Encounter (Signed)
Patient's wife Sue Lush called, she's on DPR. She says on Monday his BP 160/100 and yesterday he had a headache. She says the BP hasn't been checked today, because they don't have a meter at home. She says she will use her father in law's to check his BP for today. She denies any other symptoms at this time. I advised the office is closed, advised I will send this note to the office and someone will call tomorrow to schedule an appointment, care advice given, she verbalized understanding. She asks to call her husband's cell number [phone].   Reason for Disposition . [1] Systolic BP  >= 130 OR Diastolic >= 80 AND [2] not taking BP medications  Answer Assessment - Initial Assessment Questions 1. BLOOD PRESSURE: "What is the blood pressure?" "Did you take at least two measurements 5 minutes apart?"     160/100 2. ONSET: "When did you take your blood pressure?"     Monday 3. HOW: "How did you obtain the blood pressure?" (e.g., visiting nurse, automatic home BP monitor)     Used his father's automatic home BP monitor 4. HISTORY: "Do you have a history of high blood pressure?"     No 5. MEDICATIONS: "Are you taking any medications for blood pressure?" "Have you missed any doses recently?"     No 6. OTHER SYMPTOMS: "Do you have any symptoms?" (e.g., headache, chest pain, blurred vision, difficulty breathing, weakness)     Headache yesterday 7. PREGNANCY: "Is there any chance you are pregnant?" "When was your last menstrual period?"     N/A  Protocols used: HIGH BLOOD PRESSURE-A-AH

## 2018-10-03 ENCOUNTER — Ambulatory Visit (INDEPENDENT_AMBULATORY_CARE_PROVIDER_SITE_OTHER): Payer: PRIVATE HEALTH INSURANCE | Admitting: Nurse Practitioner

## 2018-10-03 ENCOUNTER — Encounter: Payer: Self-pay | Admitting: Nurse Practitioner

## 2018-10-03 VITALS — BP 148/82 | Ht 72.0 in | Wt 192.0 lb

## 2018-10-03 DIAGNOSIS — Z720 Tobacco use: Secondary | ICD-10-CM | POA: Diagnosis not present

## 2018-10-03 DIAGNOSIS — R05 Cough: Secondary | ICD-10-CM

## 2018-10-03 DIAGNOSIS — R030 Elevated blood-pressure reading, without diagnosis of hypertension: Secondary | ICD-10-CM | POA: Diagnosis not present

## 2018-10-03 DIAGNOSIS — K219 Gastro-esophageal reflux disease without esophagitis: Secondary | ICD-10-CM | POA: Insufficient documentation

## 2018-10-03 DIAGNOSIS — R053 Chronic cough: Secondary | ICD-10-CM | POA: Insufficient documentation

## 2018-10-03 MED ORDER — BENZONATATE 100 MG PO CAPS: 100.0000 mg | ORAL_CAPSULE | Freq: Three times a day (TID) | ORAL | 0 refills | Status: DC | PRN

## 2018-10-03 MED ORDER — ESOMEPRAZOLE MAGNESIUM 40 MG PO PACK: 40.0000 mg | PACK | Freq: Every day | ORAL | 0 refills | Status: DC

## 2018-10-03 MED ORDER — ALBUTEROL SULFATE HFA 108 (90 BASE) MCG/ACT IN AERS: 1.0000 | INHALATION_SPRAY | Freq: Four times a day (QID) | RESPIRATORY_TRACT | 0 refills | Status: DC | PRN

## 2018-10-03 NOTE — Telephone Encounter (Signed)
Spoke with the pt, made an appt with Roberto Mason today concern about BP. Pt report no other symptoms.

## 2018-10-03 NOTE — Patient Instructions (Signed)
f/up tomorrow in office for additional evalaution. Needs labs, CXR and possible ECG Need to discuss tobacco cessation.

## 2018-10-03 NOTE — Progress Notes (Addendum)
Virtual Visit via Video Note  I connected with Roberto Mason on 10/04/18 at  2:00 PM EDT by a video enabled telemedicine application and verified that I am speaking with the correct person using two identifiers.  Location: Patient: Home Provider: Office   I discussed the limitations of evaluation and management by telemedicine and the availability of in person appointments. The patient expressed understanding and agreed to proceed.  CC:pt report BP being high last 3 days: 5/12--163/88, 5/13--160/100, 5/14--148-82. denied other symptoms.   pt report coughing and chest congestions--this has been on and off since 05/2018--went to urgent care--got abx but not completely better. pt stated felt hot,cough really bad and threw up yesterday.   History of Present Illness: Elevated BP: No otc Decongestant or cough suppressant or NSAIDs or oral prednisone. intermiitent headache which prompted BP check this week. BP Readings from Last 3 Encounters:  10/03/18 (!) 148/82  11/15/17 (!) 142/78  04/05/17 (!) 150/80   Hypertension  This is a chronic problem. The current episode started more than 1 year ago. The problem has been waxing and waning since onset. The problem is uncontrolled. Associated symptoms include headaches and shortness of breath. Pertinent negatives include no anxiety, blurred vision, chest pain, malaise/fatigue, neck pain, orthopnea, palpitations, peripheral edema, PND or sweats. There are no associated agents to hypertension. Risk factors for coronary artery disease include family history, male gender, smoking/tobacco exposure, sedentary lifestyle and stress. Past treatments include nothing. Compliance problems include diet and psychosocial issues.   Cough  This is a chronic (onset 12/2017) problem. The current episode started more than 1 month ago. The problem has been waxing and waning. The cough is productive of sputum. Associated symptoms include headaches, heartburn, postnasal drip,  shortness of breath and weight loss. Pertinent negatives include no chest pain, chills, fever, hemoptysis, myalgias, nasal congestion, rash, rhinorrhea, sore throat, sweats or wheezing. The symptoms are aggravated by stress, dust and exercise. Risk factors for lung disease include smoking/tobacco exposure. He has tried OTC cough suppressant and prescription cough suppressant for the symptoms. The treatment provided no relief.  Gastroesophageal Reflux  He complains of coughing and heartburn. He reports no abdominal pain, no belching, no chest pain, no choking, no dysphagia, no early satiety, no globus sensation, no hoarse voice, no nausea, no sore throat, no stridor, no tooth decay, no water brash or no wheezing. This is a chronic problem. The current episode started more than 1 year ago. The problem occurs constantly. The problem has been unchanged. The heartburn is located in the substernum and abdomen. The heartburn is of moderate intensity. The heartburn does not wake him from sleep. The heartburn does not limit his activity. The heartburn doesn't change with position. The symptoms are aggravated by certain foods, ETOH, caffeine, stress and smoking. Associated symptoms include weight loss. Pertinent negatives include no anemia, fatigue, melena, muscle weakness or orthopnea. Risk factors include ETOH use, caffeine use, smoking/tobacco exposure and lack of exercise. He has tried an antacid and a PPI for the symptoms. The treatment provided mild relief. Past procedures do not include an abdominal ultrasound, an EGD, esophageal manometry, esophageal pH monitoring, H. pylori antibody titer or a UGI.   use of omeprazole 20mg  for several years weight loss 20lbs in last 82yrs. CXR done by urgent care clinic in [place] (normal per patient). Clear to white sputum intemittently Wt Readings from Last 3 Encounters:  10/03/18 192 lb (87.1 kg)  11/15/17 204 lb (92.5 kg)  04/05/17 211 lb (95.7 kg)  Observations/Objective: Physical Exam  Constitutional: He is oriented to person, place, and time. No distress.  Pulmonary/Chest: Effort normal.  Musculoskeletal:        General: No edema.  Neurological: He is alert and oriented to person, place, and time.  Psychiatric: He has a normal mood and affect. His behavior is normal. Thought content normal.  Vitals reviewed.   Assessment and Plan: Ebony HailDarren was seen today for hypertension and cough.  Diagnoses and all orders for this visit:  Elevated BP without diagnosis of hypertension -     B Nat Peptide; Future -     TSH; Future -     Comprehensive metabolic panel; Future  Gastroesophageal reflux disease without esophagitis -     esomeprazole (NEXIUM) 40 MG packet; Take 40 mg by mouth daily before breakfast.  Chronic cough -     benzonatate (TESSALON) 100 MG capsule; Take 1 capsule (100 mg total) by mouth 3 (three) times daily as needed for cough. -     albuterol (VENTOLIN HFA) 108 (90 Base) MCG/ACT inhaler; Inhale 1-2 puffs into the lungs every 6 (six) hours as needed. -     DG Chest 2 View; Future -     CBC w/Diff; Future  Tobacco abuse   Follow Up Instructions: f/up tomorrow in office for additional evaluation. Needs labs, CXR and possible ECG Need to discuss tobacco cessation.   I discussed the assessment and treatment plan with the patient. The patient was provided an opportunity to ask questions and all were answered. The patient agreed with the plan and demonstrated an understanding of the instructions.   The patient was advised to call back or seek an in-person evaluation if the symptoms worsen or if the condition fails to improve as anticipated.   Alysia Pennaharlotte Donzel Romack, NP

## 2018-10-04 ENCOUNTER — Other Ambulatory Visit: Payer: Self-pay

## 2018-10-04 ENCOUNTER — Ambulatory Visit: Payer: PRIVATE HEALTH INSURANCE | Admitting: Nurse Practitioner

## 2018-10-04 ENCOUNTER — Other Ambulatory Visit (INDEPENDENT_AMBULATORY_CARE_PROVIDER_SITE_OTHER): Payer: PRIVATE HEALTH INSURANCE

## 2018-10-04 ENCOUNTER — Ambulatory Visit (INDEPENDENT_AMBULATORY_CARE_PROVIDER_SITE_OTHER)
Admission: RE | Admit: 2018-10-04 | Discharge: 2018-10-04 | Disposition: A | Discharge status: Home / Self Care | Payer: PRIVATE HEALTH INSURANCE | Source: Ambulatory Visit | Attending: Nurse Practitioner | Admitting: Nurse Practitioner

## 2018-10-04 DIAGNOSIS — R053 Chronic cough: Secondary | ICD-10-CM

## 2018-10-04 DIAGNOSIS — R030 Elevated blood-pressure reading, without diagnosis of hypertension: Secondary | ICD-10-CM | POA: Diagnosis not present

## 2018-10-04 DIAGNOSIS — R05 Cough: Secondary | ICD-10-CM | POA: Diagnosis not present

## 2018-10-04 LAB — COMPREHENSIVE METABOLIC PANEL
ALT: 19 U/L (ref 0–53)
AST: 16 U/L (ref 0–37)
Albumin: 3.8 g/dL (ref 3.5–5.2)
Alkaline Phosphatase: 97 U/L (ref 39–117)
BUN: 12 mg/dL (ref 6–23)
CO2: 26 mEq/L (ref 19–32)
Calcium: 9.5 mg/dL (ref 8.4–10.5)
Chloride: 103 mEq/L (ref 96–112)
Creatinine, Ser: 0.74 mg/dL (ref 0.40–1.50)
GFR: 116.98 mL/min (ref >60.00)
Glucose, Bld: 123 mg/dL — ABNORMAL HIGH (ref 70–99)
Potassium: 3.6 mEq/L (ref 3.5–5.1)
Sodium: 138 mEq/L (ref 135–145)
Total Bilirubin: 0.7 mg/dL (ref 0.2–1.2)
Total Protein: 6.2 g/dL (ref 6.0–8.3)

## 2018-10-04 LAB — CBC WITH DIFFERENTIAL/PLATELET
Basophils Absolute: 0.1 10*3/uL (ref 0.0–0.1)
Basophils Relative: 1.2 % (ref 0.0–3.0)
Eosinophils Absolute: 0.2 10*3/uL (ref 0.0–0.7)
Eosinophils Relative: 3.8 % (ref 0.0–5.0)
HCT: 40.6 % (ref 39.0–52.0)
Hemoglobin: 14 g/dL (ref 13.0–17.0)
Lymphocytes Relative: 33.7 % (ref 12.0–46.0)
Lymphs Abs: 1.9 10*3/uL (ref 0.7–4.0)
MCHC: 34.4 g/dL (ref 30.0–36.0)
MCV: 80.5 fl (ref 78.0–100.0)
Monocytes Absolute: 0.5 10*3/uL (ref 0.1–1.0)
Monocytes Relative: 9.3 % (ref 3.0–12.0)
Neutro Abs: 2.9 10*3/uL (ref 1.4–7.7)
Neutrophils Relative %: 52 % (ref 43.0–77.0)
Platelets: 233 10*3/uL (ref 150.0–400.0)
RBC: 5.04 Mil/uL (ref 4.22–5.81)
RDW: 12.4 % (ref 11.5–15.5)
WBC: 5.6 10*3/uL (ref 4.0–10.5)

## 2018-10-04 LAB — TSH: TSH: <0.01 u[IU]/mL — ABNORMAL LOW (ref 0.35–4.50)

## 2018-10-04 LAB — BRAIN NATRIURETIC PEPTIDE: Pro B Natriuretic peptide (BNP): 83 pg/mL (ref 0.0–100.0)

## 2018-10-04 NOTE — Addendum Note (Signed)
Addended by: Michaela Corner on: 10/04/2018 02:46 PM   Modules accepted: Orders

## 2018-10-08 ENCOUNTER — Encounter: Payer: Self-pay | Admitting: Nurse Practitioner

## 2018-10-08 ENCOUNTER — Ambulatory Visit: Payer: PRIVATE HEALTH INSURANCE | Admitting: Nurse Practitioner

## 2018-10-08 VITALS — BP 146/74 | HR 108 | Temp 98.1°F | Ht 72.0 in | Wt 194.0 lb

## 2018-10-08 DIAGNOSIS — I152 Hypertension secondary to endocrine disorders: Secondary | ICD-10-CM | POA: Diagnosis not present

## 2018-10-08 DIAGNOSIS — E049 Nontoxic goiter, unspecified: Secondary | ICD-10-CM

## 2018-10-08 DIAGNOSIS — R0609 Other forms of dyspnea: Secondary | ICD-10-CM | POA: Insufficient documentation

## 2018-10-08 DIAGNOSIS — R0600 Dyspnea, unspecified: Secondary | ICD-10-CM | POA: Insufficient documentation

## 2018-10-08 DIAGNOSIS — E0590 Thyrotoxicosis, unspecified without thyrotoxic crisis or storm: Secondary | ICD-10-CM | POA: Diagnosis not present

## 2018-10-08 DIAGNOSIS — R000 Tachycardia, unspecified: Secondary | ICD-10-CM | POA: Diagnosis not present

## 2018-10-08 MED ORDER — PROPRANOLOL HCL ER BEADS 80 MG PO CP24: 80.0000 mg | ORAL_CAPSULE | Freq: Every day | ORAL | 5 refills | Status: DC

## 2018-10-08 NOTE — Patient Instructions (Addendum)
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 Also sent methimazole Make appts with endocrinology, thyroid US and echocardiogram when contacted Start propanolol tonight.  You will be contacted to schedule appt with endocrinology, echocardiogram and tyroid [place]  Go to hospital if symptoms worsen.   Hyperthyroidism  Hyperthyroidism is when the thyroid gland is too active (overactive). The thyroid gland is a small gland located in the lower front part of the neck, just in front of the windpipe (trachea). This gland makes hormones that help control how the body uses food for energy (metabolism) as well as how the heart and brain function. These hormones also play a role in keeping your bones strong. When the thyroid is overactive, it produces too much of a hormone called thyroxine. What are the causes? This condition may be caused by:  Graves' disease. This is a disorder in which the body's disease-fighting system (immune system) attacks the thyroid gland. This is the most common cause.  Inflammation of the thyroid gland.  A tumor in the thyroid gland.  Use of certain medicines, including: ? Prescription thyroid hormone replacement. ? Herbal supplements that mimic thyroid hormones. ? Amiodarone therapy.  Solid or fluid-filled lumps within your thyroid gland (thyroid nodules).  Taking in a large amount of iodine from foods or medicines. What increases the risk? You are more likely to develop this condition if:  You are male.  You have a family history of thyroid conditions.  You smoke tobacco.  You use a medicine called lithium.  You take medicines that affect the immune system (immunosuppressants). What are the signs or symptoms? Symptoms of this condition include:  Nervousness.  Inability to tolerate heat.  Unexplained weight loss.  Diarrhea.  Change in the texture of hair or skin.  Heart skipping beats or making extra beats.  Rapid heart  rate.  Loss of menstruation.  Shaky hands.  Fatigue.  Restlessness.  Sleep problems.  Enlarged thyroid gland or a lump in the thyroid (nodule). You may also have symptoms of Graves' disease, which may include:  Protruding eyes.  Dry eyes.  Red or swollen eyes.  Problems with vision. How is this diagnosed? This condition may be diagnosed based on:  Your symptoms and medical history.  A physical exam.  Blood tests.  Thyroid ultrasound. This test involves using sound waves to produce images of the thyroid gland.  A thyroid scan. A radioactive substance is injected into a vein, and images show how much iodine is present in the thyroid.  Radioactive iodine uptake test (RAIU). A small amount of radioactive iodine is given by mouth to see how much iodine the thyroid absorbs after a certain amount of time. How is this treated? Treatment depends on the cause and severity of the condition. Treatment may include:  Medicines to reduce the amount of thyroid hormone your body makes.  Radioactive iodine treatment (radioiodine therapy). This involves swallowing a small dose of radioactive iodine, in capsule or liquid form, to kill thyroid cells.  Surgery to remove part or all of your thyroid gland. You may need to take thyroid hormone replacement medicine for the rest of your life after thyroid surgery.  Medicines to help manage your symptoms. Follow these instructions at home:   Take over-the-counter and prescription medicines only as told by your health care provider.  Do not use any products that contain nicotine or tobacco, such as cigarettes and e-cigarettes. If you need help quitting, ask your health care provider.  Follow any instructions  from your health care provider about diet. You may be instructed to limit foods that contain iodine.  Keep all follow-up visits as told by your health care provider. This is important. ? You will need to have blood tests regularly so  that your health care provider can monitor your condition. Contact a health care provider if:  Your symptoms do not get better with treatment.  You have a fever.  You are taking thyroid hormone replacement medicine and you: ? Have symptoms of depression. ? Feel like you are tired all the time. ? Gain weight. Get help right away if:  You have chest pain.  You have decreased alertness or a change in your awareness.  You have abdominal pain.  You feel dizzy.  You have a rapid heartbeat.  You have an irregular heartbeat.  You have difficulty breathing. Summary  The thyroid gland is a small gland located in the lower front part of the neck, just in front of the windpipe (trachea).  Hyperthyroidism is when the thyroid gland is too active (overactive) and produces too much of a hormone called thyroxine.  The most common cause is Graves' disease, a disorder in which your immune system attacks the thyroid gland.  Hyperthyroidism can cause various symptoms, such as unexplained weight loss, nervousness, inability to tolerate heat, or changes in your heartbeat.  Treatment may include medicine to reduce the amount of thyroid hormone your body makes, radioiodine therapy, surgery, or medicines to manage symptoms. This information is not intended to replace advice given to you by your health care provider. Make sure you discuss any questions you have with your health care provider. Document Released: 05/08/2005 Document Revised: 04/18/2017 Document Reviewed: 04/18/2017 Elsevier Interactive Patient Education  2019 [employer]

## 2018-10-08 NOTE — Progress Notes (Signed)
Subjective:  Patient ID: Roberto Mason, male    DOB: 11/28/1980  Age: 38 y.o. MRN: [id number]  CC: Follow-up (pt is here to follow up on BP and coughing--cough is much better but still has some, BP is up and down--report BP today prior to appt 150/78 HR 108/ FYI--notice hands shaky/body--pt stated hisis new--going on couple months. )  Shortness of Breath  This is a new problem. The current episode started more than 1 month ago. The problem occurs constantly. The problem has been gradually worsening. Associated symptoms include orthopnea and wheezing. Pertinent negatives include no abdominal pain, chest pain, claudication, fever, headaches, hemoptysis, leg pain, leg swelling, neck pain, PND, sputum production, syncope or vomiting. The symptoms are aggravated by any activity. Risk factors include smoking. He has tried beta agonist inhalers and OTC cough suppressants for the symptoms. The treatment provided mild relief. His past medical history is significant for allergies. There is no history of pneumonia.  Palpitations   This is a new problem. The current episode started more than 1 month ago. Associated symptoms include coughing, an irregular heartbeat, malaise/fatigue, shortness of breath and weakness. Pertinent negatives include no anxiety, chest pain, diaphoresis, dizziness, fever, nausea, near-syncope, numbness, syncope or vomiting. He has tried nothing for the symptoms. Risk factors include being male, family history and smoking/tobacco exposure. His past medical history is significant for hyperthyroidism.  FHx of hyperthyroidism (father-onset in his 7040s) and paternal grandmother  Reviewed past Medical, Social and Family history today.  Outpatient Medications Prior to Visit  Medication Sig Dispense Refill  . albuterol (VENTOLIN HFA) 108 (90 Base) MCG/ACT inhaler Inhale 1-2 puffs into the lungs every 6 (six) hours as needed. 1 Inhaler 0  . benzonatate (TESSALON) 100 MG capsule Take 1 capsule  (100 mg total) by mouth 3 (three) times daily as needed for cough. 20 capsule 0  . esomeprazole (NEXIUM) 40 MG packet Take 40 mg by mouth daily before breakfast. 14 each 0   No facility-administered medications prior to visit.     ROS See HPI  Objective:  BP (!) 146/74   Pulse (!) 108   Temp 98.1 F (36.7 C) (Oral)   Ht 6' (1.829 m)   Wt 194 lb (88 kg)   SpO2 98%   BMI 26.31 kg/m   BP Readings from Last 3 Encounters:  10/08/18 (!) 146/74  10/03/18 (!) 148/82  11/15/17 (!) 142/78    Wt Readings from Last 3 Encounters:  10/08/18 194 lb (88 kg)  10/03/18 192 lb (87.1 kg)  11/15/17 204 lb (92.5 kg)    Physical Exam Vitals signs reviewed.  Constitutional:      General: He is not in acute distress.    Appearance: He is not diaphoretic.  Neck:     Musculoskeletal: Normal range of motion and neck supple.     Thyroid: Thyromegaly present. No thyroid tenderness.     Trachea: Trachea normal.  Cardiovascular:     Rate and Rhythm: Regular rhythm. Tachycardia present.     Heart sounds: Gallop present.   Pulmonary:     Effort: Pulmonary effort is normal.     Breath sounds: Normal breath sounds.  Abdominal:     General: Abdomen is flat.     Palpations: Abdomen is soft.  Musculoskeletal: Normal range of motion.     Right lower leg: No edema.     Left lower leg: No edema.  Lymphadenopathy:     Cervical: No cervical adenopathy.  Neurological:  Mental Status: He is alert and oriented to person, place, and time.     Lab Results  Component Value Date   WBC 5.6 10/04/2018   HGB 14.0 10/04/2018   HCT 40.6 10/04/2018   PLT 233.0 10/04/2018   GLUCOSE 123 (H) 10/04/2018   ALT 19 10/04/2018   AST 16 10/04/2018   NA 138 10/04/2018   K 3.6 10/04/2018   CL 103 10/04/2018   CREATININE 0.74 10/04/2018   BUN 12 10/04/2018   CO2 26 10/04/2018   TSH 0.01 (L) 10/08/2018    Dg Chest 2 View  Result Date: 10/04/2018 CLINICAL DATA:  Persistent cough for 2 months EXAM: CHEST  - 2 VIEW COMPARISON:  None. FINDINGS: The heart size and mediastinal contours are within normal limits. Both lungs are clear. The visualized skeletal structures are unremarkable. IMPRESSION: No active cardiopulmonary disease. Electronically Signed   By: Alcide Clever M.D.   On: 10/04/2018 15:17   ECG: S. Tachycardia with incomplete bundle branch block, normal intervals.  Assessment & Plan:   Roberto Mason was seen today for follow-up.  Diagnoses and all orders for this visit:  Hypertension due to endocrine disorder -     propranolol (INDERAL XL) 80 MG 24 hr capsule; Take 1 capsule (80 mg total) by mouth at bedtime. -     Ambulatory referral to Endocrinology -     ECHOCARDIOGRAM COMPLETE; Future  Tachycardia -     EKG 12-Lead -     propranolol (INDERAL XL) 80 MG 24 hr capsule; Take 1 capsule (80 mg total) by mouth at bedtime. -     Ambulatory referral to Endocrinology -     TSH -     T3, free -     T4, free -     Thyroid Peroxidase Antibodies (TPO) (REFL) -     ECHOCARDIOGRAM COMPLETE; Future  Hyperthyroidism -     propranolol (INDERAL XL) 80 MG 24 hr capsule; Take 1 capsule (80 mg total) by mouth at bedtime. -     Ambulatory referral to Endocrinology -     TSH -     T3, free -     T4, free -     Thyroid Peroxidase Antibodies (TPO) (REFL) -     US THYROID; Future -     methimazole (TAPAZOLE) 5 MG tablet; Take 1 tablet (5 mg total) by mouth 3 (three) times daily.  Enlarged thyroid -     US THYROID; Future -     methimazole (TAPAZOLE) 5 MG tablet; Take 1 tablet (5 mg total) by mouth 3 (three) times daily.  Dyspnea on exertion -     ECHOCARDIOGRAM COMPLETE; Future   I am having Roberto Mason start on propranolol and methimazole. I am also having him maintain his benzonatate, albuterol, and esomeprazole.  Meds ordered this encounter  Medications  . propranolol (INDERAL XL) 80 MG 24 hr capsule    Sig: Take 1 capsule (80 mg total) by mouth at bedtime.    Dispense:  30 capsule     Refill:  5    Order Specific Question:   Supervising Provider    Answer:   Dianne Dun [[id number]]  . methimazole (TAPAZOLE) 5 MG tablet    Sig: Take 1 tablet (5 mg total) by mouth 3 (three) times daily.    Dispense:  90 tablet    Refill:  1    Order Specific Question:   Supervising Provider    Answer:   Dayton Martes,  Bryn Gulling [[id number]]    Problem List Items Addressed This Visit      Cardiovascular and Mediastinum   Hypertension due to endocrine disorder - Primary   Relevant Medications   propranolol (INDERAL XL) 80 MG 24 hr capsule   Other Relevant Orders   Ambulatory referral to Endocrinology   ECHOCARDIOGRAM COMPLETE     Endocrine   Enlarged thyroid   Relevant Medications   propranolol (INDERAL XL) 80 MG 24 hr capsule   methimazole (TAPAZOLE) 5 MG tablet   Other Relevant Orders   US THYROID   Hyperthyroidism   Relevant Medications   propranolol (INDERAL XL) 80 MG 24 hr capsule   methimazole (TAPAZOLE) 5 MG tablet   Other Relevant Orders   Ambulatory referral to Endocrinology   TSH (Completed)   T3, free (Completed)   T4, free (Completed)   Thyroid Peroxidase Antibodies (TPO) (REFL) (Completed)   US THYROID     Other   Dyspnea on exertion   Relevant Orders   ECHOCARDIOGRAM COMPLETE    Other Visit Diagnoses    Tachycardia       Relevant Medications   propranolol (INDERAL XL) 80 MG 24 hr capsule   Other Relevant Orders   EKG 12-Lead (Completed)   Ambulatory referral to Endocrinology   TSH (Completed)   T3, free (Completed)   T4, free (Completed)   Thyroid Peroxidase Antibodies (TPO) (REFL) (Completed)   ECHOCARDIOGRAM COMPLETE      Follow-up: Return in about 2 weeks (around 10/22/2018) for video call (HTN and tachycardia).  Alysia Penna, NP

## 2018-10-09 ENCOUNTER — Encounter: Payer: Self-pay | Admitting: Nurse Practitioner

## 2018-10-09 LAB — TSH: TSH: 0.01 m[IU]/L — ABNORMAL LOW (ref 0.40–4.50)

## 2018-10-09 LAB — THYROID PEROXIDASE ANTIBODIES (TPO) (REFL): Thyroperoxidase Ab SerPl-aCnc: 182 IU/mL — ABNORMAL HIGH (ref <9)

## 2018-10-09 LAB — T3, FREE: T3, Free: >20 pg/mL — ABNORMAL HIGH (ref 2.3–4.2)

## 2018-10-09 LAB — T4, FREE: Free T4: 3.3 ng/dL — ABNORMAL HIGH (ref 0.8–1.8)

## 2018-10-09 MED ORDER — METHIMAZOLE 5 MG PO TABS: 5.0000 mg | ORAL_TABLET | Freq: Three times a day (TID) | ORAL | 1 refills | Status: DC

## 2018-10-10 ENCOUNTER — Telehealth: Payer: Self-pay

## 2018-10-10 NOTE — Telephone Encounter (Signed)
Copied from CRM [id number]. Topic: Referral - Status >> Oct 10, 2018  3:03 PM Roberto Mason, [place] wrote: Reason for CRM: Patient is calling in stating he still has not heard from office regarding his referral.

## 2018-10-11 NOTE — Telephone Encounter (Signed)
Spoke with patient. Scheduled [place] and given pt date/time. Pt aware endo office will call with date/time

## 2018-10-11 NOTE — Telephone Encounter (Signed)
Please help check, we placed 2 referral for the pt on 10/08/2018.

## 2018-10-17 ENCOUNTER — Ambulatory Visit
Admission: RE | Admit: 2018-10-17 | Discharge: 2018-10-17 | Disposition: A | Discharge status: Home / Self Care | Payer: PRIVATE HEALTH INSURANCE | Source: Ambulatory Visit | Attending: Nurse Practitioner | Admitting: Nurse Practitioner

## 2018-10-17 DIAGNOSIS — E049 Nontoxic goiter, unspecified: Secondary | ICD-10-CM

## 2018-10-17 DIAGNOSIS — E0590 Thyrotoxicosis, unspecified without thyrotoxic crisis or storm: Secondary | ICD-10-CM

## 2018-10-22 ENCOUNTER — Ambulatory Visit (INDEPENDENT_AMBULATORY_CARE_PROVIDER_SITE_OTHER): Payer: PRIVATE HEALTH INSURANCE | Admitting: Nurse Practitioner

## 2018-10-22 ENCOUNTER — Encounter: Payer: Self-pay | Admitting: Nurse Practitioner

## 2018-10-22 VITALS — BP 141/77 | HR 96 | Ht 72.0 in

## 2018-10-22 DIAGNOSIS — K219 Gastro-esophageal reflux disease without esophagitis: Secondary | ICD-10-CM

## 2018-10-22 DIAGNOSIS — I152 Hypertension secondary to endocrine disorders: Secondary | ICD-10-CM | POA: Diagnosis not present

## 2018-10-22 DIAGNOSIS — Z720 Tobacco use: Secondary | ICD-10-CM

## 2018-10-22 DIAGNOSIS — Z716 Tobacco abuse counseling: Secondary | ICD-10-CM

## 2018-10-22 MED ORDER — BUPROPION HCL ER (SR) 150 MG PO TB12: ORAL_TABLET | ORAL | 2 refills | Status: DC

## 2018-10-22 MED ORDER — ESOMEPRAZOLE MAGNESIUM 20 MG PO CPDR: 20.0000 mg | DELAYED_RELEASE_CAPSULE | Freq: Every day | ORAL | 1 refills | Status: DC

## 2018-10-22 NOTE — Progress Notes (Signed)
Virtual Visit via Video Note  I connected with Roberto Mason on 10/22/18 at  2:00 PM EDT by a video enabled telemedicine application and verified that I am speaking with the correct person using two identifiers.  Location: Patient: Set designer Provider: Office   I discussed the limitations of evaluation and management by telemedicine and the availability of in person appointments. The patient expressed understanding and agreed to proceed.  CC: HTN and tachycardia--BP is down--HR is around 90.  History of Present Illness: appt with endo: 10/23/2018 Echocardiogram (waiting for appt)   Nicotine Dependence  Presents for initial visit. Symptoms include cravings, fatigue and irritability. Preferred tobacco types include cigarettes. Preferred cigarette types include filtered. Preferred strength is regular. His urge triggers include company of smokers, disruptive behavior, drinking coffee, driving and stress. Risk factors do not include contact with substance, decrease in perceived risk, drinking alcohol, meal time or perceived media message about smoking.His first smoke is from 6 to 8 AM. He smokes 1 pack of cigarettes per day. He started smoking when he was >38 years old. Past treatments include nicotine patch, varenicline and nicotine gum. The treatment provided moderate relief. Compliance with prior treatments has been variable. Deanta is ready to quit. Albino has tried to quit 40 time. There is no history of alcohol abuse and drug use.   HTN: Unchanged, reports compliance with propanolol. No LE edema. No PND Persistent fatigue, SOB and cough (no change) BP Readings from Last 3 Encounters:  10/22/18 (!) 141/77  10/08/18 (!) 146/74  10/03/18 (!) 148/82   Observations/Objective: Physical Exam  Constitutional: He is oriented to person, place, and time. No distress.  Pulmonary/Chest: Effort normal.  Neurological: He is alert and oriented to person, place, and time.  Psychiatric: He has a normal mood  and affect. His behavior is normal. Thought content normal.  Vitals reviewed.  Assessment and Plan: Vasil was seen today for follow-up.  Diagnoses and all orders for this visit:  Tobacco abuse -     buPROPion (WELLBUTRIN SR) 150 MG 12 hr tablet; Take 1tab once a day (AM) x 7days, then increase to 1tab AM and PM continuous  Encounter for smoking cessation counseling -     buPROPion (WELLBUTRIN SR) 150 MG 12 hr tablet; Take 1tab once a day (AM) x 7days, then increase to 1tab AM and PM continuous  Gastroesophageal reflux disease without esophagitis -     esomeprazole (NEXIUM) 20 MG capsule; Take 1 capsule (20 mg total) by mouth daily at 12 noon.  Hypertension due to endocrine disorder   Follow Up Instructions: F/up in 3weeks.(video appt) Pick quit date, start wellbutrin 1week prior to quit date. Maintain appt with endo tomorrow. Schedule appt with opthalmology   I discussed the assessment and treatment plan with the patient. The patient was provided an opportunity to ask questions and all were answered. The patient agreed with the plan and demonstrated an understanding of the instructions.   The patient was advised to call back or seek an in-person evaluation if the symptoms worsen or if the condition fails to improve as anticipated.  Alysia Penna, NP

## 2018-10-22 NOTE — Patient Instructions (Addendum)
F/up in 3weeks.(video appt) Pick quit date, start wellbutrin 1week prior to quit date. Maintain appt with endo tomorrow. Schedule appt with opthalmology

## 2018-10-23 ENCOUNTER — Encounter: Payer: Self-pay | Admitting: Nurse Practitioner

## 2018-10-23 ENCOUNTER — Encounter: Payer: Self-pay | Admitting: Internal Medicine

## 2018-10-23 ENCOUNTER — Ambulatory Visit (INDEPENDENT_AMBULATORY_CARE_PROVIDER_SITE_OTHER): Payer: PRIVATE HEALTH INSURANCE | Admitting: Internal Medicine

## 2018-10-23 ENCOUNTER — Other Ambulatory Visit: Payer: Self-pay

## 2018-10-23 DIAGNOSIS — E0590 Thyrotoxicosis, unspecified without thyrotoxic crisis or storm: Secondary | ICD-10-CM

## 2018-10-23 DIAGNOSIS — E0500 Thyrotoxicosis with diffuse goiter without thyrotoxic crisis or storm: Secondary | ICD-10-CM | POA: Diagnosis not present

## 2018-10-23 NOTE — Progress Notes (Signed)
Virtual Visit via Video Note  I connected with [name] on 10/23/18 at  8:30 AM EDT by a video enabled telemedicine application and verified that I am speaking with the correct person using two identifiers.   I discussed the limitations of evaluation and management by telemedicine and the availability of in person appointments. The patient expressed understanding and agreed to proceed.  -Location of the patient : Car  -Location of the provider : office -The names of all persons participating in the telemedicine service : Pt and myself     Name: Roberto Mason  MRN/ DOB: [id number], [birth year]    Age/ Sex: 38 y.o., male    PCP: Anne Ng, NP   Reason for Endocrinology Evaluation: Hyperthyroidism     Date of Initial Endocrinology Evaluation: 10/23/2018     HPI: [name] is a 38 y.o. male with a past medical history of GERD. The patient presented for initial endocrinology clinic visit on 10/23/2018 for consultative assistance with his hyperthyroidism.   Pt presented to his PCP in May, 2020 with c/o sob that started a month prior to his presentation associated with palpitations. He was found to have a suppressed TSH  At <0.01 uIU/mL . Repeat TFT's confirmed hyperthyroidism diagnosis with suppressed TSH and elevated FT4 at 3.3 ng/dL.    He was started on Methimazole 15 mg daily in May, 2020  He has been tolerating methimazole well. He denies any side effects except for the occasional nausea. He has lost 12 lbs in the past year. He has intermittent diarrhea as well as heat intolerance, feeling jittery and shaky. Has occasional heat intolerance.    Pt noted local neck enlargement but denies any pain, dysphagia or hoarseness.  No prior exposure to radiation  No biotin intake    Father and paternal grandmother with hyperthyroidism  HISTORY:  Past Medical History:  Past Medical History:  Diagnosis Date  . Allergy   . Cold 01/25/2016   pt states x 2 weeks with  lingering cough about 10x day-  no fever, nonproductive  . GERD (gastroesophageal reflux disease)   . PONV (postoperative nausea and vomiting)   . 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 Tobacco abuse     Past Surgical History:  Past Surgical History:  Procedure Laterality Date  . FISTULA PLUG N/A 01/27/2016   Procedure: REPAIR OF PERIRECTAL FISTULA intersphincteric fistula;  Surgeon: Karie Soda, MD;  Location: [facility];  Service: General;  Laterality: N/A;  . IRRIGATION AND DEBRIDEMENT ABSCESS N/A 09/22/2015   Procedure: anoscope IRRIGATION AND DEBRIDEMENT ABSCESS;  Surgeon: Claud Kelp, MD;  Location: [facility];  Service: General;  Laterality: N/A;  . KNEE ARTHROSCOPY Right   . MOUTH SURGERY    . RECTAL EXAM UNDER ANESTHESIA N/A 01/27/2016   Procedure: RECTAL EXAM UNDER ANESTHESIA;  Surgeon: Karie Soda, MD;  Location: [facility];  Service: General;  Laterality: N/A;      Social History:  reports that he has been smoking cigarettes. He has a 20.00 pack-year smoking history. His smokeless tobacco use includes chew. He reports that he does not drink alcohol or use drugs.  Family History: family history includes COPD in his paternal grandfather; Diabetes in his maternal grandfather, maternal grandmother, paternal grandfather, and paternal grandmother; Glaucoma in his mother; Heart disease in his father; Heart failure in his father; Hyperlipidemia in his father; Hypertension in his father; Hyperthyroidism in his father and paternal grandmother; Lung cancer  in his paternal grandfather.   HOME MEDICATIONS: Allergies as of 10/23/2018      Reactions   Oxycodone Nausea Only      Medication List       Accurate as of October 23, 2018  8:21 AM. If you have any questions, ask your nurse or doctor.        albuterol 108 (90 Base) MCG/ACT inhaler Commonly known as:  VENTOLIN HFA Inhale 1-2 puffs into the lungs every 6 (six) hours as needed.   benzonatate 100 MG capsule  Commonly known as:  TESSALON Take 1 capsule (100 mg total) by mouth 3 (three) times daily as needed for cough.   buPROPion 150 MG 12 hr tablet Commonly known as:  WELLBUTRIN SR Take 1tab once a day (AM) x 7days, then increase to 1tab AM and PM continuous   esomeprazole 20 MG capsule Commonly known as:  NexIUM Take 1 capsule (20 mg total) by mouth daily at 12 noon.   methimazole 5 MG tablet Commonly known as:  TAPAZOLE Take 1 tablet (5 mg total) by mouth 3 (three) times daily.   propranolol 80 MG 24 hr capsule Commonly known as:  Inderal XL Take 1 capsule (80 mg total) by mouth at bedtime.         REVIEW OF SYSTEMS: A comprehensive ROS was conducted with the patient and is negative except as per HPI and below:  Review of Systems  Constitutional: Positive for weight loss. Negative for fever.  HENT: Negative for congestion and sore throat.   Eyes: Negative for blurred vision and pain.  Respiratory: Negative for cough and shortness of breath.   Cardiovascular: Positive for palpitations. Negative for chest pain.  Gastrointestinal: Positive for diarrhea and nausea.  Neurological: Positive for tremors. Negative for tingling.  Psychiatric/Behavioral: Negative for depression. The patient is nervous/anxious.         DATA REVIEWED:  Results for FIDEL, CAGGIANO (MRN [id number]) as of 10/23/2018 08:22  Ref. Range 10/04/2018 15:10 10/08/2018 14:42  TSH Latest Ref Range: 0.40 - 4.50 mIU/L <0.01 Repeated and verified X2. (L) 0.01 (L)  Triiodothyronine,Free,Serum Latest Ref Range: 2.3 - 4.2 pg/mL  >20.0 (H)  T4,Free(Direct) Latest Ref Range: 0.8 - 1.8 ng/dL  3.3 (H)  Thyroperoxidase Ab SerPl-aCnc Latest Ref Range: <9 IU/mL  182 (H)   Results for ANDRAY, ASSEFA (MRN [id number]) as of 10/23/2018 08:22  Ref. Range 10/04/2018 15:10  Sodium Latest Ref Range: 135 - 145 mEq/L 138  Potassium Latest Ref Range: 3.5 - 5.1 mEq/L 3.6  Chloride Latest Ref Range: 96 - 112 mEq/L 103  CO2 Latest Ref  Range: 19 - 32 mEq/L 26  Glucose Latest Ref Range: 70 - 99 mg/dL 528 (H)  BUN Latest Ref Range: 6 - 23 mg/dL 12  Creatinine Latest Ref Range: 0.40 - 1.50 mg/dL 4.13  Calcium Latest Ref Range: 8.4 - 10.5 mg/dL 9.5  Alkaline Phosphatase Latest Ref Range: 39 - 117 U/L 97  Albumin Latest Ref Range: 3.5 - 5.2 g/dL 3.8  AST Latest Ref Range: 0 - 37 U/L 16  ALT Latest Ref Range: 0 - 53 U/L 19  Total Protein Latest Ref Range: 6.0 - 8.3 g/dL 6.2  Total Bilirubin Latest Ref Range: 0.2 - 1.2 mg/dL 0.7  GFR Latest Ref Range: >60.00 mL/min 116.98       ASSESSMENT/PLAN/RECOMMENDATIONS:   1. Hyperthyroidism Secondary to Graves' Disease:    - Clinically and biochemically hyperthyroid  - No local neck symptoms - We discussed  that Graves' Disease is a result of an autoimmune condition involving the thyroid.    - We discussed with pt the benefits of methimazole in the Tx of hyperthyroidism, as well as the possible side effects/complications of anti-thyroid drug Tx (specifically detailing the rare, but serious side effect of agranulocytosis). He was informed of need for regular thyroid function monitoring while on methimazole to ensure appropriate dosage without over-treatment. As well, we discussed the possible side effects of methimazole including the chance of rash, the small chance of liver irritation/juandice and the <=1 in 300-400 chance of sudden onset agranulocytosis.  We discussed importance of going to ED promptly (and stopping methimazole) if hewere to develop significant fever with severe sore throat of other evidence of acute infection.     - We extensively discussed the various treatment options for hyperthyroidism and Graves disease including ablation therapy with radioactive iodine versus antithyroid drug treatment versus surgical therapy.  We recommended to the patient that we felt, at this time, that I-131 ablation therapy would be most optimal.  We discussed the various possible benefits  versus side effects of the various therapies.   - I carefully explained to the patient that one of the consequences of I-131 ablation treatment would likely be permanent hypothyroidism which would require long-term replacement therapy with LT4.   Medications : Methimazole 15 mg daily     2. Graves' Disease:   - No extra-thyroidal manifestations of graves' disease   Labs in 2 weeks  F/U in 2 months- pt will call to schedule    I discussed the assessment and treatment plan with the patient. The patient was provided an opportunity to ask questions and all were answered. The patient agreed with the plan and demonstrated an understanding of the instructions.   The patient was advised to call back or seek an in-person evaluation if the symptoms worsen or if the condition fails to improve as anticipated.     Signed electronically by: Lyndle HerrlichAbby Jaralla Jamesrobert Ohanesian, MD  [facility]  [facility] [address] [place], [place] [address] Phone: [phone] FAX: [phone]   CC: Anne Ngche, Charlotte Lum, NP [address] [place] [address] Phone: [phone] Fax: [phone]   Return to Endocrinology clinic as below: Future Appointments  Date Time Provider Department Center  10/23/2018  8:30 AM Elody Kleinsasser, Konrad DoloresIbtehal Jaralla, MD [facility] None  11/01/2018  3:15 PM [id number]  11/19/2018  2:00 PM Nche, Bonna Gainsharlotte Lum, NP [facility]

## 2018-10-26 ENCOUNTER — Other Ambulatory Visit: Payer: Self-pay | Admitting: Nurse Practitioner

## 2018-10-26 DIAGNOSIS — R053 Chronic cough: Secondary | ICD-10-CM

## 2018-10-26 DIAGNOSIS — R05 Cough: Secondary | ICD-10-CM

## 2018-10-31 ENCOUNTER — Other Ambulatory Visit: Payer: Self-pay | Admitting: Nurse Practitioner

## 2018-10-31 ENCOUNTER — Telehealth (HOSPITAL_COMMUNITY): Payer: Self-pay | Admitting: *Deleted

## 2018-10-31 DIAGNOSIS — E0590 Thyrotoxicosis, unspecified without thyrotoxic crisis or storm: Secondary | ICD-10-CM

## 2018-10-31 DIAGNOSIS — E049 Nontoxic goiter, unspecified: Secondary | ICD-10-CM

## 2018-10-31 NOTE — Telephone Encounter (Signed)
Tried to call patient about echo and COVID screening, answering machine was full.

## 2018-11-01 ENCOUNTER — Ambulatory Visit (HOSPITAL_COMMUNITY): Payer: PRIVATE HEALTH INSURANCE | Attending: Nurse Practitioner

## 2018-11-01 ENCOUNTER — Other Ambulatory Visit: Payer: Self-pay

## 2018-11-01 DIAGNOSIS — R0609 Other forms of dyspnea: Secondary | ICD-10-CM | POA: Insufficient documentation

## 2018-11-01 DIAGNOSIS — R000 Tachycardia, unspecified: Secondary | ICD-10-CM | POA: Diagnosis present

## 2018-11-01 DIAGNOSIS — I152 Hypertension secondary to endocrine disorders: Secondary | ICD-10-CM | POA: Diagnosis not present

## 2018-11-01 DIAGNOSIS — R0600 Dyspnea, unspecified: Secondary | ICD-10-CM

## 2018-11-19 ENCOUNTER — Encounter: Payer: Self-pay | Admitting: Nurse Practitioner

## 2018-11-19 ENCOUNTER — Ambulatory Visit (INDEPENDENT_AMBULATORY_CARE_PROVIDER_SITE_OTHER): Payer: PRIVATE HEALTH INSURANCE | Admitting: Nurse Practitioner

## 2018-11-19 DIAGNOSIS — E0590 Thyrotoxicosis, unspecified without thyrotoxic crisis or storm: Secondary | ICD-10-CM

## 2018-11-19 DIAGNOSIS — I152 Hypertension secondary to endocrine disorders: Secondary | ICD-10-CM

## 2018-11-19 DIAGNOSIS — R000 Tachycardia, unspecified: Secondary | ICD-10-CM

## 2018-11-19 MED ORDER — PROPRANOLOL HCL 20 MG PO TABS: ORAL_TABLET | ORAL | 0 refills | Status: DC

## 2018-11-19 NOTE — Progress Notes (Signed)
Virtual Visit via Video Note  I connected with Roberto Mason on 11/19/18 at  2:00 PM EDT by a video enabled telemedicine application and verified that I am speaking with the correct person using two identifiers.  Location: Patient: In car Provider: Office   I discussed the limitations of evaluation and management by telemedicine and the availability of in person appointments. The patient expressed understanding and agreed to proceed.  History of Present Illness:  HTN: Reports Home SBP 130s and HR 80s BP Readings from Last 3 Encounters:  10/22/18 (!) 141/77  10/08/18 (!) 146/74  10/03/18 (!) 148/82  reports worsening fatigue and daytime somnolence since last OV, worse midday, denies any interrupted sleep (10pm to 6am), no snoring. No LE edema.  Observations/Objective: Physical Exam  Constitutional: He is oriented to person, place, and time. He appears well-developed. No distress.  Pulmonary/Chest: Effort normal.  Musculoskeletal:        General: No edema.  Neurological: He is alert and oriented to person, place, and time.  Psychiatric: He has a normal mood and affect. His behavior is normal. Thought content normal.   Assessment and Plan: Roberto Mason was seen today for follow-up.  Diagnoses and all orders for this visit:  Tachycardia -     propranolol (INDERAL) 20 MG tablet; Take 1 tablet (20 mg total) by mouth 2 (two) times daily for 14 days, THEN 1 tablet (20 mg total) daily for 14 days.  Hyperthyroidism -     propranolol (INDERAL) 20 MG tablet; Take 1 tablet (20 mg total) by mouth 2 (two) times daily for 14 days, THEN 1 tablet (20 mg total) daily for 14 days.  Hypertension due to endocrine disorder   Follow Up Instructions: Stop propanolol 8mg  Follow instructions on new propanolol dose. Will enter referral to sleep center if no improvement in 2weeks   I discussed the assessment and treatment plan with the patient. The patient was provided an opportunity to ask questions  and all were answered. The patient agreed with the plan and demonstrated an understanding of the instructions.   The patient was advised to call back or seek an in-person evaluation if the symptoms worsen or if the condition fails to improve as anticipated.   Wilfred Lacy, NP

## 2018-11-19 NOTE — Patient Instructions (Signed)
Stop propanolol 8mg  Follow instructions on new propanolol dose. Will enter referral to sleep center if no improvement in 2weeks

## 2018-11-20 ENCOUNTER — Encounter: Payer: Self-pay | Admitting: Nurse Practitioner

## 2018-11-24 ENCOUNTER — Encounter: Payer: Self-pay | Admitting: Nurse Practitioner

## 2018-12-04 ENCOUNTER — Encounter: Payer: Self-pay | Admitting: Nurse Practitioner

## 2018-12-04 ENCOUNTER — Other Ambulatory Visit: Payer: Self-pay

## 2018-12-04 ENCOUNTER — Ambulatory Visit: Payer: PRIVATE HEALTH INSURANCE | Admitting: Nurse Practitioner

## 2018-12-04 ENCOUNTER — Encounter: Payer: PRIVATE HEALTH INSURANCE | Admitting: Nurse Practitioner

## 2018-12-04 VITALS — BP 154/86 | HR 99 | Ht 72.0 in

## 2018-12-06 ENCOUNTER — Encounter: Payer: Self-pay | Admitting: Nurse Practitioner

## 2018-12-06 ENCOUNTER — Ambulatory Visit (INDEPENDENT_AMBULATORY_CARE_PROVIDER_SITE_OTHER): Payer: PRIVATE HEALTH INSURANCE | Admitting: Nurse Practitioner

## 2018-12-06 VITALS — Ht 72.0 in

## 2018-12-06 DIAGNOSIS — R000 Tachycardia, unspecified: Secondary | ICD-10-CM | POA: Diagnosis not present

## 2018-12-06 DIAGNOSIS — I152 Hypertension secondary to endocrine disorders: Secondary | ICD-10-CM | POA: Diagnosis not present

## 2018-12-06 DIAGNOSIS — R400 Somnolence: Secondary | ICD-10-CM

## 2018-12-06 DIAGNOSIS — R5382 Chronic fatigue, unspecified: Secondary | ICD-10-CM | POA: Diagnosis not present

## 2018-12-06 MED ORDER — PROPRANOLOL HCL 80 MG PO TABS: 80.0000 mg | ORAL_TABLET | Freq: Every day | ORAL | 5 refills | Status: DC

## 2018-12-06 NOTE — Progress Notes (Signed)
This encounter was created in error - please disregard.

## 2018-12-06 NOTE — Patient Instructions (Addendum)
Resume propanolol 80mg  at HS. You will be contacted to schedule appt with neurology.

## 2018-12-06 NOTE — Patient Instructions (Signed)
appt rescheduled to 12/06/2018

## 2018-12-06 NOTE — Progress Notes (Signed)
Virtual Visit via Video Note  I connected with Roberto Mason on 12/06/18 at 10:45 AM EDT by a video enabled telemedicine application and verified that I am speaking with the correct person using two identifiers.  Location: Patient: In his car Provider: Office   I discussed the limitations of evaluation and management by telemedicine and the availability of in person appointments. The patient expressed understanding and agreed to proceed.  CC: HTN and fatigue f/up  History of Present Illness: HTN: Elevated BP based yesterday's reading. Propranolol dose decreased 2weeks ago due to fatigue and somnolence. No change in symptoms with decreased dose. BP Readings from Last 3 Encounters:  12/04/18 (!) 154/86  10/22/18 (!) 141/77  10/08/18 (!) 146/74   Observations/Objective: Physical Exam  Constitutional: He is oriented to person, place, and time. No distress.  Cardiovascular: Normal rate.  Pulmonary/Chest: Effort normal.  Neurological: He is alert and oriented to person, place, and time.  Psychiatric: He has a normal mood and affect. His behavior is normal. Thought content normal.   Assessment and Plan: Roberto Mason was seen today for follow-up.  Diagnoses and all orders for this visit:  Hypertension due to endocrine disorder -     propranolol (INDERAL) 80 MG tablet; Take 1 tablet (80 mg total) by mouth at bedtime.  Tachycardia -     propranolol (INDERAL) 80 MG tablet; Take 1 tablet (80 mg total) by mouth at bedtime.  Chronic fatigue -     Ambulatory referral to Neurology  Daytime somnolence -     Ambulatory referral to Neurology   Follow Up Instructions: Resume propanolol 80mg . Entered referral to neurology for sleep study. F/up in 55month   I discussed the assessment and treatment plan with the patient. The patient was provided an opportunity to ask questions and all were answered. The patient agreed with the plan and demonstrated an understanding of the instructions.   The  patient was advised to call back or seek an in-person evaluation if the symptoms worsen or if the condition fails to improve as anticipated.  Wilfred Lacy, NP

## 2018-12-11 ENCOUNTER — Encounter: Payer: Self-pay | Admitting: *Deleted

## 2018-12-20 ENCOUNTER — Other Ambulatory Visit: Payer: Self-pay

## 2018-12-21 ENCOUNTER — Other Ambulatory Visit: Payer: Self-pay | Admitting: Nurse Practitioner

## 2018-12-21 DIAGNOSIS — E0590 Thyrotoxicosis, unspecified without thyrotoxic crisis or storm: Secondary | ICD-10-CM

## 2018-12-21 DIAGNOSIS — E049 Nontoxic goiter, unspecified: Secondary | ICD-10-CM

## 2018-12-23 ENCOUNTER — Other Ambulatory Visit: Payer: Self-pay

## 2018-12-23 ENCOUNTER — Encounter: Payer: Self-pay | Admitting: Internal Medicine

## 2018-12-23 ENCOUNTER — Ambulatory Visit: Payer: PRIVATE HEALTH INSURANCE | Admitting: Internal Medicine

## 2018-12-23 VITALS — BP 122/68 | HR 80 | Temp 97.9°F | Ht 72.0 in | Wt 195.2 lb

## 2018-12-23 DIAGNOSIS — E049 Nontoxic goiter, unspecified: Secondary | ICD-10-CM

## 2018-12-23 DIAGNOSIS — R748 Abnormal levels of other serum enzymes: Secondary | ICD-10-CM

## 2018-12-23 DIAGNOSIS — E0500 Thyrotoxicosis with diffuse goiter without thyrotoxic crisis or storm: Secondary | ICD-10-CM

## 2018-12-23 DIAGNOSIS — E0590 Thyrotoxicosis, unspecified without thyrotoxic crisis or storm: Secondary | ICD-10-CM

## 2018-12-23 LAB — HEPATIC FUNCTION PANEL
ALT: 17 U/L (ref 0–53)
AST: 15 U/L (ref 0–37)
Albumin: 4.2 g/dL (ref 3.5–5.2)
Alkaline Phosphatase: 174 U/L — ABNORMAL HIGH (ref 39–117)
Bilirubin, Direct: 0.1 mg/dL (ref 0.0–0.3)
Total Bilirubin: 0.7 mg/dL (ref 0.2–1.2)
Total Protein: 6.4 g/dL (ref 6.0–8.3)

## 2018-12-23 LAB — T4, FREE: Free T4: 4.17 ng/dL — ABNORMAL HIGH (ref 0.60–1.60)

## 2018-12-23 LAB — TSH: TSH: <0.01 u[IU]/mL — ABNORMAL LOW (ref 0.35–4.50)

## 2018-12-23 NOTE — Patient Instructions (Signed)
-   We recommend that you follow these hyperthyroidism instructions at home:  1) Take Methimazole 15 mg  Daily   If you develop severe sore throat with high fevers OR develop unexplained yellowing of your skin, eyes, under your tongue, severe abdominal pain with nausea or vomiting --> then please get evaluated immediately.  2) propranolol 80 mg daily  3) Get repeat thyroid labs today and in 6 weeks .   It is ESSENTIAL to get follow-up labs to help avoid over or undertreatment of your hyperthyroidism - both of which can be dangerous to your health.

## 2018-12-23 NOTE — Progress Notes (Signed)
Name: Roberto Mason  MRN/ DOB: [id number], Jan 27, 1981    Age/ Sex: 38 y.o., male     PCP: Nche, Charlene Brooke, NP   Reason for Endocrinology Evaluation: Hyperthyroidism     Initial Endocrinology Clinic Visit: 10/23/18    PATIENT IDENTIFIER: [name] is a 38 y.o., male with a past medical history of GERD. He has followed with [facility] since 10/23/18 for consultative assistance with management of his Hyperthyroidism  HISTORICAL SUMMARY: The patient presented to his PCP in May, 2020 with c/o sob that started a month prior to his presentation associated with palpitations. He was found to have a suppressed TSH  At <0.01 uIU/mL . Repeat TFT's confirmed hyperthyroidism diagnosis with suppressed TSH and elevated FT4 at 3.3 ng/dL.    He was started on Methimazole 15 mg daily in May, 2020  Father and paternal grandmother with hyperthyroidism  SUBJECTIVE:   During last visit (10/23/18): This was a virtual visit, he was advised to continue Methimazole and stop by the lab in 2 weeks.   Today (12/24/2018):  Roberto Mason is here for a 2 month follow up on his hyperthyroidism. He was supposed to have labs in 2 weeks back in June, but somehow this was not done. Pt admits to non-compliance with methimazole, started taking it 2 weeks ago.     Today he is having weight gain, but denies palpitations, tremors resolved.    No anxiety or local neck symptoms.    No biotin intake   Denies any nausea or diarrhea.   He is having fatigue .    Feels eyes are stretched.      ROS:  As per HPI.   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   Tobacco abuse    Past Surgical  History:  Past Surgical History:  Procedure Laterality Date   FISTULA PLUG N/A 01/27/2016   Procedure: REPAIR OF PERIRECTAL FISTULA intersphincteric fistula;  Surgeon: Michael Boston, MD;  Location: [facility];  Service: General;  Laterality: N/A;   IRRIGATION AND DEBRIDEMENT ABSCESS N/A 09/22/2015   Procedure: anoscope IRRIGATION AND DEBRIDEMENT ABSCESS;  Surgeon: Fanny Skates, MD;  Location: [facility];  Service: General;  Laterality: N/A;   KNEE ARTHROSCOPY Right    MOUTH SURGERY     RECTAL EXAM UNDER ANESTHESIA N/A 01/27/2016   Procedure: RECTAL EXAM UNDER ANESTHESIA;  Surgeon: Michael Boston, MD;  Location: [facility];  Service: General;  Laterality: N/A;    Social History:  reports that he has been smoking cigarettes. He has a 20.00 pack-year smoking history. He has quit using smokeless tobacco.  His smokeless tobacco use included chew. He reports that he does not drink alcohol or use drugs. Family History:  Family History  Problem Relation Age of Onset   Glaucoma Mother    Heart disease Father    Hyperlipidemia Father    Hypertension Father    Heart failure Father    Hyperthyroidism Father    Diabetes Maternal Grandmother    Diabetes Maternal Grandfather    Diabetes Paternal Grandmother    Hyperthyroidism Paternal Grandmother    Diabetes Paternal Grandfather    COPD  Paternal Grandfather    Lung cancer Paternal Grandfather      HOME MEDICATIONS: Allergies as of 12/23/2018      Reactions   Oxycodone Nausea Only      Medication List       Accurate as of December 23, 2018 11:59 PM. If you have any questions, ask your nurse or doctor.        benzonatate 100 MG capsule Commonly known as: TESSALON Take 1 capsule (100 mg total) by mouth 3 (three) times daily as needed for cough.   buPROPion 150 MG 12 hr tablet Commonly known as: WELLBUTRIN SR TAKE 1 TABLET BY MOUTH IN THE AM X 7DAYS, THEN INCREASE TO 1TAB AM AND PM CONTINUOUSLY   esomeprazole 20 MG capsule Commonly  known as: NexIUM Take 1 capsule (20 mg total) by mouth daily at 12 noon.   methimazole 5 MG tablet Commonly known as: TAPAZOLE Take 1 tablet (5 mg total) by mouth 3 (three) times daily.   propranolol 80 MG tablet Commonly known as: INDERAL Take 1 tablet (80 mg total) by mouth at bedtime.         OBJECTIVE:   PHYSICAL EXAM: VS: BP 122/68 (BP Location: Left Arm, Patient Position: Sitting, Cuff Size: Normal)    Pulse 80    Temp 97.9 F (36.6 C)    Ht 6' (1.829 m)    Wt 195 lb 3.2 oz (88.5 kg)    SpO2 97%    BMI 26.47 kg/m    EXAM: General: Pt appears well and is in NAD  Hydration: Well-hydrated with moist mucous membranes and good skin turgor  Eyes: External eye exam normal without stare, lid lag or exophthalmos.  EOM intact.  PERRL.  Ears, Nose, Throat: Hearing: Grossly intact bilaterally Dental: Good dentition  Throat: Clear without mass, erythema or exudate  Neck: General: Supple without adenopathy. Thyroid: Thyroid is prominent.  No nodules appreciated. No thyroid bruit.  Lungs: Clear with good BS bilat with no rales, rhonchi, or wheezes  Heart: Auscultation: RRR.  Abdomen: Normoactive bowel sounds, soft, nontender, without masses or organomegaly palpable  Extremities:  BL LE: No pretibial edema normal ROM and strength.  Skin: Hair: Texture and amount normal with gender appropriate distribution Skin Inspection: No rashes. Skin Palpation: Skin temperature, texture, and thickness normal to palpation  Neuro: Cranial nerves: II - XII grossly intact  Motor: Normal strength throughout DTRs: 2+ and symmetric in UE without delay in relaxation phase  Mental Status: Judgment, insight: Intact Orientation: Oriented to time, place, and person  Mood and affect: No depression, anxiety, or agitation     DATA REVIEWED:  Results for Roberto Mason, Roberto Mason (MRN [id number]) as of 12/24/2018 08:58  Ref. Range 12/23/2018 11:29  Alkaline Phosphatase Latest Ref Range: 39 - 117 U/L 174 (H)    Albumin Latest Ref Range: 3.5 - 5.2 g/dL 4.2  AST Latest Ref Range: 0 - 37 U/L 15  ALT Latest Ref Range: 0 - 53 U/L 17  Total Protein Latest Ref Range: 6.0 - 8.3 g/dL 6.4  Bilirubin, Direct Latest Ref Range: 0.0 - 0.3 mg/dL 0.1  Total Bilirubin Latest Ref Range: 0.2 - 1.2 mg/dL 0.7  TSH Latest Ref Range: 0.35 - 4.50 uIU/mL <0.01 (L)  T4,Free(Direct) Latest Ref Range: 0.60 - 1.60 ng/dL 7.824.17 (H)     ASSESSMENT / PLAN / RECOMMENDATIONS:   1. Hyperthyroidism Secondary to Graves' Disease:    Plan:  Clinically he is euthyroid   Biochemically he continues to be  hyperthyroid, this is due to non-compliance with methimazole intake.   Discussed consequences of hyperthyroidism with increased cardiac arrhythmia, osteoporosis , morbidity and mortality.   Medications   Increase Methimazole to 20 mg daily ( 4 tablets daily)   2. Graves' Disease:   - No extra-thyroidal manifestations of graves' disease  3. Elevated Alkaline Phosphatase:   - Pt asymptomatic  - Most likely due to increase bone resorption , methimazole could cause a cholestatic picture as well, but given that he has not been taking it, most likely this is related to bone.  - Will continue to monitor.   Labs in 6 weeks    F/u in 3 months    Addendum: Discussed results with pt on 12/24/18 @ 9 am.   Signed electronically by: Lyndle HerrlichAbby Jaralla Allix Blomquist, MD  [facility]  [facility] [address] [place], [place] [address] Phone: [phone] FAX: [phone]      CC: Anne Ngche, Charlotte Lum, NP [address] [place] [address] Phone: [phone]  Fax: [phone]   Return to Endocrinology clinic as below: Future Appointments  Date Time Provider Department Center  01/08/2019 10:15 AM Anne NgNche, Charlotte Lum, NP [facility]  03/21/2019  8:00 AM [facility] None  03/26/2019  1:20 PM Kielyn Kardell, Konrad DoloresIbtehal Jaralla, MD [facility] None

## 2018-12-24 ENCOUNTER — Encounter: Payer: Self-pay | Admitting: Internal Medicine

## 2018-12-24 DIAGNOSIS — R748 Abnormal levels of other serum enzymes: Secondary | ICD-10-CM | POA: Insufficient documentation

## 2018-12-24 MED ORDER — METHIMAZOLE 5 MG PO TABS: 20.0000 mg | ORAL_TABLET | Freq: Every day | ORAL | 2 refills | Status: DC

## 2018-12-26 LAB — TRAB (TSH RECEPTOR BINDING ANTIBODY): TRAB: >40 IU/L — ABNORMAL HIGH (ref <=2.00)

## 2019-01-07 ENCOUNTER — Telehealth: Payer: Self-pay | Admitting: Nurse Practitioner

## 2019-01-07 NOTE — Telephone Encounter (Signed)

## 2019-01-08 ENCOUNTER — Ambulatory Visit: Payer: PRIVATE HEALTH INSURANCE | Admitting: Nurse Practitioner

## 2019-01-08 ENCOUNTER — Encounter: Payer: Self-pay | Admitting: Nurse Practitioner

## 2019-01-08 ENCOUNTER — Other Ambulatory Visit: Payer: Self-pay

## 2019-01-08 VITALS — BP 134/78 | HR 76 | Temp 98.6°F | Ht 72.0 in | Wt 196.4 lb

## 2019-01-08 DIAGNOSIS — I10 Essential (primary) hypertension: Secondary | ICD-10-CM | POA: Diagnosis not present

## 2019-01-08 DIAGNOSIS — I152 Hypertension secondary to endocrine disorders: Secondary | ICD-10-CM

## 2019-01-08 DIAGNOSIS — R5382 Chronic fatigue, unspecified: Secondary | ICD-10-CM

## 2019-01-08 DIAGNOSIS — R000 Tachycardia, unspecified: Secondary | ICD-10-CM | POA: Diagnosis not present

## 2019-01-08 MED ORDER — VITAMIN B-12 100 MCG PO TABS: 100.0000 ug | ORAL_TABLET | Freq: Every day | ORAL | 0 refills | Status: DC

## 2019-01-08 MED ORDER — PROPRANOLOL HCL 80 MG PO TABS: 80.0000 mg | ORAL_TABLET | Freq: Every day | ORAL | 3 refills | Status: DC

## 2019-01-08 MED ORDER — VITAMIN D 25 MCG (1000 UNIT) PO TABS: 1000.0000 [IU] | ORAL_TABLET | Freq: Every day | ORAL | 0 refills | Status: DC

## 2019-01-08 NOTE — Progress Notes (Signed)
Subjective:  Patient ID: Roberto Mason, male    DOB: [birth year]  Age: 38 y.o. MRN: [id number]  CC: Follow-up (follow up on BP--denied flu shot today)  HPI Tobacco use: Decided not to use wellbutrin or nicotine patch.  HTN:  improved with propanolol 80mg . BP Readings from Last 3 Encounters:  01/08/19 134/78  12/23/18 122/68  12/04/18 (!) 154/86   Chronic fatigue: No change Has upcoming appt with neurology for possible sleep study.  Reviewed past Medical, Social and Family history today.  Outpatient Medications Prior to Visit  Medication Sig Dispense Refill  . esomeprazole (NEXIUM) 20 MG capsule Take 1 capsule (20 mg total) by mouth daily at 12 noon. 90 capsule 1  . methimazole (TAPAZOLE) 5 MG tablet Take 4 tablets (20 mg total) by mouth daily. 120 tablet 2  . propranolol (INDERAL) 80 MG tablet Take 1 tablet (80 mg total) by mouth at bedtime. 30 tablet 5  . benzonatate (TESSALON) 100 MG capsule Take 1 capsule (100 mg total) by mouth 3 (three) times daily as needed for cough. (Patient not taking: Reported on 12/04/2018) 20 capsule 0  . buPROPion (WELLBUTRIN SR) 150 MG 12 hr tablet TAKE 1 TABLET BY MOUTH IN THE AM X 7DAYS, THEN INCREASE TO 1TAB AM AND PM CONTINUOUSLY     No facility-administered medications prior to visit.     ROS See HPI  Objective:  BP 134/78   Pulse 76   Temp 98.6 F (37 C) (Tympanic)   Ht 6' (1.829 m)   Wt 196 lb 6.4 oz (89.1 kg)   SpO2 98%   BMI 26.64 kg/m   BP Readings from Last 3 Encounters:  01/08/19 134/78  12/23/18 122/68  12/04/18 (!) 154/86    Wt Readings from Last 3 Encounters:  01/08/19 196 lb 6.4 oz (89.1 kg)  12/23/18 195 lb 3.2 oz (88.5 kg)  10/08/18 194 lb (88 kg)    Physical Exam Vitals signs reviewed.  Neck:     Musculoskeletal: Normal range of motion and neck supple.  Cardiovascular:     Rate and Rhythm: Normal rate and regular rhythm.     Pulses: Normal pulses.     Heart sounds: Normal heart sounds.  Pulmonary:    Effort: Pulmonary effort is normal.     Breath sounds: Normal breath sounds.  Lymphadenopathy:     Cervical: No cervical adenopathy.  Neurological:     Mental Status: He is alert and oriented to person, place, and time.  Psychiatric:        Mood and Affect: Mood normal.        Behavior: Behavior normal.        Thought Content: Thought content normal.     Lab Results  Component Value Date   WBC 5.6 10/04/2018   HGB 14.0 10/04/2018   HCT 40.6 10/04/2018   PLT 233.0 10/04/2018   GLUCOSE 123 (H) 10/04/2018   ALT 17 12/23/2018   AST 15 12/23/2018   NA 138 10/04/2018   K 3.6 10/04/2018   CL 103 10/04/2018   CREATININE 0.74 10/04/2018   BUN 12 10/04/2018   CO2 26 10/04/2018   TSH <0.01 (L) 12/23/2018    US Thyroid  Result Date: 10/17/2018 CLINICAL DATA:  Hyperthyroid. EXAM: THYROID ULTRASOUND TECHNIQUE: Ultrasound examination of the thyroid gland and adjacent soft tissues was performed. COMPARISON:  None. FINDINGS: Parenchymal Echotexture: Markedly heterogenous - mild diffuse glandular hyperemia (image 9 and 22). Isthmus: Enlarged measuring 0.9 cm in diameter Right lobe:  Enlarged measuring 5.0 x 3.2 x 3.1 cm Left lobe: Enlarged measuring 5.7 x 2.8 x 2.4 cm _________________________________________________________ Estimated total number of nodules >/= 1 cm: 0 Number of spongiform nodules >/=  2 cm not described below (TR1): 0 Number of mixed cystic and solid nodules >/= 1.5 cm not described below (TR2): 0 _________________________________________________________ No discrete nodules are seen within the thyroid gland. IMPRESSION: Enlarged, markedly heterogeneous and potentially hyperemic thyroid gland without discrete nodule or mass. Findings are nonspecific though could be seen in the setting of an acute thyroiditis. Clinical correlation is advised. Electronically Signed   By: Simonne ComeJohn  Watts M.D.   On: 10/17/2018 16:00    Assessment & Plan:   Roberto Mason was seen today for follow-up.   Diagnoses and all orders for this visit:  Essential hypertension  Chronic fatigue -     cholecalciferol (VITAMIN D3) 25 MCG (1000 UT) tablet; Take 1 tablet (1,000 Units total) by mouth daily. -     vitamin B-12 (CYANOCOBALAMIN) 100 MCG tablet; Take 1 tablet (100 mcg total) by mouth daily.  Tachycardia -     propranolol (INDERAL) 80 MG tablet; Take 1 tablet (80 mg total) by mouth at bedtime.  Hypertension due to endocrine disorder -     propranolol (INDERAL) 80 MG tablet; Take 1 tablet (80 mg total) by mouth at bedtime.   I have discontinued Roberto Mason's benzonatate and buPROPion. I am also having him start on cholecalciferol and vitamin B-12. Additionally, I am having him maintain his esomeprazole, methimazole, and propranolol.  Meds ordered this encounter  Medications  . cholecalciferol (VITAMIN D3) 25 MCG (1000 UT) tablet    Sig: Take 1 tablet (1,000 Units total) by mouth daily.    Dispense:  90 tablet    Refill:  0    Order Specific Question:   Supervising Provider    Answer:   Dianne DunARON, TALIA M [[id number]]  . vitamin B-12 (CYANOCOBALAMIN) 100 MCG tablet    Sig: Take 1 tablet (100 mcg total) by mouth daily.    Dispense:  90 tablet    Refill:  0    Order Specific Question:   Supervising Provider    Answer:   Dianne DunARON, TALIA M [[id number]]  . propranolol (INDERAL) 80 MG tablet    Sig: Take 1 tablet (80 mg total) by mouth at bedtime.    Dispense:  90 tablet    Refill:  3    Order Specific Question:   Supervising Provider    Answer:   Dianne DunARON, TALIA M [[id number]]    Problem List Items Addressed This Visit      Cardiovascular and Mediastinum   Hypertension due to endocrine disorder   Relevant Medications   propranolol (INDERAL) 80 MG tablet     Other   Elevated BP without diagnosis of hypertension - Primary    Other Visit Diagnoses    Chronic fatigue       Relevant Medications   cholecalciferol (VITAMIN D3) 25 MCG (1000 UT) tablet   vitamin B-12 (CYANOCOBALAMIN) 100 MCG tablet    Tachycardia       Relevant Medications   propranolol (INDERAL) 80 MG tablet       Follow-up: Return in about 3 months (around 04/10/2019) for CPE (fasting).  Roberto Pennaharlotte Franki Stemen, NP

## 2019-01-08 NOTE — Patient Instructions (Addendum)
Maintain propanolol as prescribed  Let me know when to quit tobacco use.  Start vitamin D and B12 supplement as prescribed.   Fatigue If you have fatigue, you feel tired all the time and have a lack of energy or a lack of motivation. Fatigue may make it difficult to start or complete tasks because of exhaustion. In general, occasional or mild fatigue is often a normal response to activity or life. However, long-lasting (chronic) or extreme fatigue may be a symptom of a medical condition. Follow these instructions at home: General instructions  Watch your fatigue for any changes.  Go to bed and get up at the same time every day.  Avoid fatigue by pacing yourself during the day and getting enough sleep at night.  Maintain a healthy weight. Medicines  Take over-the-counter and prescription medicines only as told by your health care provider.  Take a multivitamin, if told by your health care provider.  Do not use herbal or dietary supplements unless they are approved by your health care provider. Activity   Exercise regularly, as told by your health care provider.  Use or practice techniques to help you relax, such as yoga, tai chi, meditation, or massage therapy. Eating and drinking   Avoid heavy meals in the evening.  Eat a well-balanced diet, which includes lean proteins, whole grains, plenty of fruits and vegetables, and low-fat dairy products.  Avoid consuming too much caffeine.  Avoid the use of alcohol.  Drink enough fluid to keep your urine pale yellow. Lifestyle  Change situations that cause you stress. Try to keep your work and personal schedule in balance.  Do not use any products that contain nicotine or tobacco, such as cigarettes and e-cigarettes. If you need help quitting, ask your health care provider.  Do not use drugs. Contact a health care provider if:  Your fatigue does not get better.  You have a fever.  You suddenly lose or gain  weight.  You have headaches.  You have trouble falling asleep or sleeping through the night.  You feel angry, guilty, anxious, or sad.  You are unable to have a bowel movement (constipation).  Your skin is dry.  You have swelling in your legs or another part of your body. Get help right away if:  You feel confused.  Your vision is blurry.  You feel faint or you pass out.  You have a severe headache.  You have severe pain in your abdomen, your back, or the area between your waist and hips (pelvis).  You have chest pain, shortness of breath, or an irregular or fast heartbeat.  You are unable to urinate, or you urinate less than normal.  You have abnormal bleeding, such as bleeding from the rectum, vagina, nose, lungs, or nipples.  You vomit blood.  You have thoughts about hurting yourself or others. If you ever feel like you may hurt yourself or others, or have thoughts about taking your own life, get help right away. You can go to your nearest emergency department or call:  Your local emergency services (911 in the [place]  A suicide crisis helpline, such as the [facility] at 639 269 2278. This is open 24 hours a day. Summary  If you have fatigue, you feel tired all the time and have a lack of energy or a lack of motivation.  Fatigue may make it difficult to start or complete tasks because of exhaustion.  Long-lasting (chronic) or extreme fatigue may be a symptom of  a medical condition.  Exercise regularly, as told by your health care provider.  Change situations that cause you stress. Try to keep your work and personal schedule in balance. This information is not intended to replace advice given to you by your health care provider. Make sure you discuss any questions you have with your health care provider. Document Released: 03/05/2007 Document Revised: 08/29/2018 Document Reviewed: 01/31/2017 Elsevier Patient Education  2020 [employer].

## 2019-01-09 ENCOUNTER — Institutional Professional Consult (permissible substitution): Payer: Self-pay | Admitting: Neurology

## 2019-01-09 ENCOUNTER — Telehealth: Payer: Self-pay

## 2019-01-09 NOTE — Telephone Encounter (Signed)
Pt did not show for their appt with Dr. Athar today.  

## 2019-01-13 ENCOUNTER — Encounter: Payer: Self-pay | Admitting: Neurology

## 2019-02-11 ENCOUNTER — Other Ambulatory Visit: Payer: Self-pay | Admitting: Nurse Practitioner

## 2019-02-11 DIAGNOSIS — Z720 Tobacco use: Secondary | ICD-10-CM

## 2019-02-11 DIAGNOSIS — Z716 Tobacco abuse counseling: Secondary | ICD-10-CM

## 2019-02-11 NOTE — Telephone Encounter (Signed)
Please inquire from patient if he requested for this 

## 2019-02-11 NOTE — Telephone Encounter (Signed)
Please advise, this rx discontinue on 11/19/2018.

## 2019-03-21 ENCOUNTER — Other Ambulatory Visit (INDEPENDENT_AMBULATORY_CARE_PROVIDER_SITE_OTHER): Payer: PRIVATE HEALTH INSURANCE

## 2019-03-21 ENCOUNTER — Other Ambulatory Visit: Payer: Self-pay

## 2019-03-21 DIAGNOSIS — E049 Nontoxic goiter, unspecified: Secondary | ICD-10-CM

## 2019-03-21 DIAGNOSIS — E0590 Thyrotoxicosis, unspecified without thyrotoxic crisis or storm: Secondary | ICD-10-CM

## 2019-03-21 LAB — TSH: TSH: 8.18 u[IU]/mL — ABNORMAL HIGH (ref 0.35–4.50)

## 2019-03-21 LAB — T4, FREE: Free T4: 0.32 ng/dL — ABNORMAL LOW (ref 0.60–1.60)

## 2019-03-24 ENCOUNTER — Other Ambulatory Visit: Payer: Self-pay

## 2019-03-24 MED ORDER — METHIMAZOLE 5 MG PO TABS: 10.0000 mg | ORAL_TABLET | Freq: Every day | ORAL | 3 refills | Status: DC

## 2019-03-26 ENCOUNTER — Ambulatory Visit: Payer: PRIVATE HEALTH INSURANCE | Admitting: Internal Medicine

## 2019-03-26 NOTE — Progress Notes (Deleted)
Name: Roberto Mason  MRN/ DOB: [id number], 1980-11-22    Age/ Sex: 38 y.o., male     PCP: Nche, Roberto Brooke, NP   Reason for Endocrinology Evaluation: Hyperthyroidism     Initial Endocrinology Clinic Visit: 10/23/18    PATIENT IDENTIFIER: Roberto Mason is a 38 y.o., male with a past medical history of GERD. He has followed with [facility] since 10/23/18 for consultative assistance with management of his Hyperthyroidism  HISTORICAL SUMMARY: The patient presented to his PCP in May, 2020 with c/o sob that started a month prior to his presentation associated with palpitations. He was found to have a suppressed TSH  At <0.01 uIU/mL . Repeat TFT's confirmed hyperthyroidism diagnosis with suppressed TSH and elevated FT4 at 3.3 ng/dL.    He was started on Methimazole 15 mg daily in May, 2020  Father and paternal grandmother with hyperthyroidism  SUBJECTIVE:   During last visit (12/23/18): We increased methimazole   Today (03/26/2019):  [name] is here for a 3 month follow up on his hyperthyroidism.  Today he is having weight gain, but denies palpitations, tremors resolved.    No anxiety or local neck symptoms.    No biotin intake   Denies any nausea or diarrhea.         ROS:  As per HPI.   HISTORY:  Past Medical History:  Past Medical History:  Diagnosis Date  . Allergy   . Cold 01/25/2016   pt states x 2 weeks with lingering cough about 10x day-  no fever, nonproductive  . GERD (gastroesophageal reflux disease)   . PONV (postoperative nausea and vomiting)   . 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 Tobacco abuse    Past Surgical History:  Past Surgical History:  Procedure Laterality Date  . FISTULA PLUG N/A 01/27/2016   Procedure: REPAIR OF PERIRECTAL FISTULA intersphincteric fistula;  Surgeon: Michael Boston, MD;  Location: [facility];  Service: General;  Laterality: N/A;  . IRRIGATION AND DEBRIDEMENT ABSCESS N/A  09/22/2015   Procedure: anoscope IRRIGATION AND DEBRIDEMENT ABSCESS;  Surgeon: Fanny Skates, MD;  Location: [facility];  Service: General;  Laterality: N/A;  . KNEE ARTHROSCOPY Right   . MOUTH SURGERY    . RECTAL EXAM UNDER ANESTHESIA N/A 01/27/2016   Procedure: RECTAL EXAM UNDER ANESTHESIA;  Surgeon: Michael Boston, MD;  Location: [facility];  Service: General;  Laterality: N/A;    Social History:  reports that he has been smoking cigarettes. He has a 20.00 pack-year smoking history. He has quit using smokeless tobacco.  His smokeless tobacco use included chew. He reports that he does not drink alcohol or use drugs. Family History:  Family History  Problem Relation Age of Onset  . Glaucoma Mother   . Heart disease Father   . Hyperlipidemia Father   . Hypertension Father   . Heart failure Father   . Hyperthyroidism Father   . Diabetes Maternal Grandmother   . Diabetes Maternal Grandfather   . Diabetes Paternal Grandmother   . Hyperthyroidism Paternal Grandmother   . Diabetes Paternal Grandfather   . COPD Paternal Grandfather   . Lung cancer Paternal Grandfather      HOME MEDICATIONS: Allergies as of 03/26/2019      Reactions   Oxycodone Nausea Only      Medication List       Accurate as of March 26, 2019  1:00 PM. If you have any questions, ask your  nurse or doctor.        cholecalciferol 25 MCG (1000 UT) tablet Commonly known as: VITAMIN D3 Take 1 tablet (1,000 Units total) by mouth daily.   esomeprazole 20 MG capsule Commonly known as: NexIUM Take 1 capsule (20 mg total) by mouth daily at 12 noon.   methimazole 5 MG tablet Commonly known as: TAPAZOLE Take 2 tablets (10 mg total) by mouth daily.   propranolol 80 MG tablet Commonly known as: INDERAL Take 1 tablet (80 mg total) by mouth at bedtime.   vitamin B-12 100 MCG tablet Commonly known as: CYANOCOBALAMIN Take 1 tablet (100 mcg total) by mouth daily.         OBJECTIVE:   PHYSICAL EXAM: VS: There were no  vitals taken for this visit.   EXAM: General: Pt appears well and is in NAD  Hydration: Well-hydrated with moist mucous membranes and good skin turgor  Eyes: External eye exam normal without stare, lid lag or exophthalmos.  EOM intact.  PERRL.  Ears, Nose, Throat: Hearing: Grossly intact bilaterally Dental: Good dentition  Throat: Clear without mass, erythema or exudate  Neck: General: Supple without adenopathy. Thyroid: Thyroid is prominent.  No nodules appreciated. No thyroid bruit.  Lungs: Clear with good BS bilat with no rales, rhonchi, or wheezes  Heart: Auscultation: RRR.  Abdomen: Normoactive bowel sounds, soft, nontender, without masses or organomegaly palpable  Extremities:  BL LE: No pretibial edema normal ROM and strength.  Skin: Hair: Texture and amount normal with gender appropriate distribution Skin Inspection: No rashes. Skin Palpation: Skin temperature, texture, and thickness normal to palpation  Neuro: Cranial nerves: II - XII grossly intact  Motor: Normal strength throughout DTRs: 2+ and symmetric in UE without delay in relaxation phase  Mental Status: Judgment, insight: Intact Orientation: Oriented to time, place, and person  Mood and affect: No depression, anxiety, or agitation     DATA REVIEWED:  Results for [name], [name] (MRN [id number]) as of 12/24/2018 08:58  Ref. Range 12/23/2018 11:29  Alkaline Phosphatase Latest Ref Range: 39 - 117 U/L 174 (H)  Albumin Latest Ref Range: 3.5 - 5.2 g/dL 4.2  AST Latest Ref Range: 0 - 37 U/L 15  ALT Latest Ref Range: 0 - 53 U/L 17  Total Protein Latest Ref Range: 6.0 - 8.3 g/dL 6.4  Bilirubin, Direct Latest Ref Range: 0.0 - 0.3 mg/dL 0.1  Total Bilirubin Latest Ref Range: 0.2 - 1.2 mg/dL 0.7  TSH Latest Ref Range: 0.35 - 4.50 uIU/mL <0.01 (L)  T4,Free(Direct) Latest Ref Range: 0.60 - 1.60 ng/dL 6.31 (H)     ASSESSMENT / PLAN / RECOMMENDATIONS:   1. Hyperthyroidism Secondary to Graves' Disease:    Plan:   Clinically he is euthyroid   Biochemically he continues to be hyperthyroid, this is due to non-compliance with methimazole intake.   Discussed consequences of hyperthyroidism with increased cardiac arrhythmia, osteoporosis , morbidity and mortality.   Medications   Increase Methimazole to 20 mg daily ( 4 tablets daily)   2. Graves' Disease:   - No extra-thyroidal manifestations of graves' disease  3. Elevated Alkaline Phosphatase:   - Pt asymptomatic  - Most likely due to increase bone resorption , methimazole could cause a cholestatic picture as well, but given that he has not been taking it, most likely this is related to bone.  - Will continue to monitor.   Labs in 6 weeks    F/u in 3 months    Addendum: Discussed results with pt  on 12/24/18 @ 9 am.   Signed electronically by: Lyndle HerrlichAbby Jaralla , MD  [facility]  [facility] [address] [place], [place] [address] Phone: [phone] FAX: [phone]      CC: Anne Ngche, Charlotte Lum, NP [address] [place] [address] Phone: [phone]  Fax: [phone]   Return to Endocrinology clinic as below: Future Appointments  Date Time Provider Department Center  03/26/2019  1:20 PM , Konrad DoloresIbtehal Jaralla, MD [id number] None  04/11/2019  8:45 AM Nche, Bonna Gainsharlotte Lum, NP [facility]

## 2019-03-30 ENCOUNTER — Other Ambulatory Visit: Payer: Self-pay | Admitting: Internal Medicine

## 2019-03-30 DIAGNOSIS — E049 Nontoxic goiter, unspecified: Secondary | ICD-10-CM

## 2019-03-30 DIAGNOSIS — E0590 Thyrotoxicosis, unspecified without thyrotoxic crisis or storm: Secondary | ICD-10-CM

## 2019-04-06 ENCOUNTER — Other Ambulatory Visit: Payer: Self-pay | Admitting: Internal Medicine

## 2019-04-06 DIAGNOSIS — E0590 Thyrotoxicosis, unspecified without thyrotoxic crisis or storm: Secondary | ICD-10-CM

## 2019-04-06 DIAGNOSIS — E049 Nontoxic goiter, unspecified: Secondary | ICD-10-CM

## 2019-04-11 ENCOUNTER — Other Ambulatory Visit: Payer: Self-pay

## 2019-04-11 ENCOUNTER — Encounter: Payer: Self-pay | Admitting: Nurse Practitioner

## 2019-04-11 ENCOUNTER — Ambulatory Visit (INDEPENDENT_AMBULATORY_CARE_PROVIDER_SITE_OTHER): Payer: PRIVATE HEALTH INSURANCE | Admitting: Nurse Practitioner

## 2019-04-11 VITALS — BP 136/94 | HR 70 | Temp 97.4°F | Ht 72.0 in | Wt 211.4 lb

## 2019-04-11 DIAGNOSIS — R5382 Chronic fatigue, unspecified: Secondary | ICD-10-CM | POA: Insufficient documentation

## 2019-04-11 DIAGNOSIS — Z1211 Encounter for screening for malignant neoplasm of colon: Secondary | ICD-10-CM | POA: Diagnosis not present

## 2019-04-11 DIAGNOSIS — I152 Hypertension secondary to endocrine disorders: Secondary | ICD-10-CM

## 2019-04-11 DIAGNOSIS — Z720 Tobacco use: Secondary | ICD-10-CM

## 2019-04-11 DIAGNOSIS — Z136 Encounter for screening for cardiovascular disorders: Secondary | ICD-10-CM

## 2019-04-11 DIAGNOSIS — Z0001 Encounter for general adult medical examination with abnormal findings: Secondary | ICD-10-CM

## 2019-04-11 DIAGNOSIS — R739 Hyperglycemia, unspecified: Secondary | ICD-10-CM | POA: Insufficient documentation

## 2019-04-11 DIAGNOSIS — K219 Gastro-esophageal reflux disease without esophagitis: Secondary | ICD-10-CM

## 2019-04-11 DIAGNOSIS — E559 Vitamin D deficiency, unspecified: Secondary | ICD-10-CM

## 2019-04-11 DIAGNOSIS — R748 Abnormal levels of other serum enzymes: Secondary | ICD-10-CM

## 2019-04-11 DIAGNOSIS — Z13220 Encounter for screening for lipoid disorders: Secondary | ICD-10-CM

## 2019-04-11 LAB — HEPATIC FUNCTION PANEL
ALT: 18 U/L (ref 0–53)
AST: 17 U/L (ref 0–37)
Albumin: 4.8 g/dL (ref 3.5–5.2)
Alkaline Phosphatase: 192 U/L — ABNORMAL HIGH (ref 39–117)
Bilirubin, Direct: 0.2 mg/dL (ref 0.0–0.3)
Total Bilirubin: 1 mg/dL (ref 0.2–1.2)
Total Protein: 7.3 g/dL (ref 6.0–8.3)

## 2019-04-11 LAB — LIPID PANEL
Cholesterol: 175 mg/dL (ref 0–200)
HDL: 41.2 mg/dL (ref >39.00)
LDL Cholesterol: 124 mg/dL — ABNORMAL HIGH (ref 0–99)
NonHDL: 133.94
Total CHOL/HDL Ratio: 4
Triglycerides: 52 mg/dL (ref 0.0–149.0)
VLDL: 10.4 mg/dL (ref 0.0–40.0)

## 2019-04-11 LAB — HEMOGLOBIN A1C: Hgb A1c MFr Bld: 5.8 % (ref 4.6–6.5)

## 2019-04-11 LAB — IFOBT (OCCULT BLOOD): IFOBT: NEGATIVE

## 2019-04-11 MED ORDER — VITAMIN D 25 MCG (1000 UNIT) PO TABS: 1000.0000 [IU] | ORAL_TABLET | Freq: Every day | ORAL | 0 refills | Status: DC

## 2019-04-11 MED ORDER — ESOMEPRAZOLE MAGNESIUM 20 MG PO CPDR: 20.0000 mg | DELAYED_RELEASE_CAPSULE | Freq: Every day | ORAL | 1 refills | Status: DC

## 2019-04-11 MED ORDER — VITAMIN B-12 100 MCG PO TABS: 100.0000 ug | ORAL_TABLET | Freq: Every day | ORAL | 0 refills | Status: DC

## 2019-04-11 NOTE — Patient Instructions (Addendum)
hgbA1c of 5.8 indicates prediabetes Lipid panel indicates mild elevation in LDL. Hepatic panel indicates continues increase in ALP since 12/2018. I entered referral to GI and forward a copy of results to Dr. Lonzo Cloud.  It is important to make chnages to your diet (DASH diet)  Maintain appt with endocrinology.  You need to schedule annual eye exam due to your risk for glaucoma.  I strongly encourage you to quit tobacco use. Let me know when you are ready   Health Maintenance, Male Adopting a healthy lifestyle and getting preventive care are important in promoting health and wellness. Ask your health care provider about:  The right schedule for you to have regular tests and exams.  Things you can do on your own to prevent diseases and keep yourself healthy. What should I know about diet, weight, and exercise? Eat a healthy diet   Eat a diet that includes plenty of vegetables, fruits, low-fat dairy products, and lean protein.  Do not eat a lot of foods that are high in solid fats, added sugars, or sodium. Maintain a healthy weight Body mass index (BMI) is a measurement that can be used to identify possible weight problems. It estimates body fat based on height and weight. Your health care provider can help determine your BMI and help you achieve or maintain a healthy weight. Get regular exercise Get regular exercise. This is one of the most important things you can do for your health. Most adults should:  Exercise for at least 150 minutes each week. The exercise should increase your heart rate and make you sweat (moderate-intensity exercise).  Do strengthening exercises at least twice a week. This is in addition to the moderate-intensity exercise.  Spend less time sitting. Even light physical activity can be beneficial. Watch cholesterol and blood lipids Have your blood tested for lipids and cholesterol at 40 years of age, then have this test every 5 years. You may need to have  your cholesterol levels checked more often if:  Your lipid or cholesterol levels are high.  You are older than 40 years of age.  You are at high risk for heart disease. What should I know about cancer screening? Many types of cancers can be detected early and may often be prevented. Depending on your health history and family history, you may need to have cancer screening at various ages. This may include screening for:  Colorectal cancer.  Prostate cancer.  Skin cancer.  Lung cancer. What should I know about heart disease, diabetes, and high blood pressure? Blood pressure and heart disease  High blood pressure causes heart disease and increases the risk of stroke. This is more likely to develop in people who have high blood pressure readings, are of African descent, or are overweight.  Talk with your health care provider about your target blood pressure readings.  Have your blood pressure checked: ? Every 3-5 years if you are 63-60 years of age. ? Every year if you are 67 years old or older.  If you are between the ages of 13 and 15 and are a current or former smoker, ask your health care provider if you should have a one-time screening for abdominal aortic aneurysm (AAA). Diabetes Have regular diabetes screenings. This checks your fasting blood sugar level. Have the screening done:  Once every three years after age 5 if you are at a normal weight and have a low risk for diabetes.  More often and at a younger age if you are overweight  or have a high risk for diabetes. What should I know about preventing infection? Hepatitis B If you have a higher risk for hepatitis B, you should be screened for this virus. Talk with your health care provider to find out if you are at risk for hepatitis B infection. Hepatitis C Blood testing is recommended for:  Everyone born from [birth year] through 1967.  Anyone with known risk factors for hepatitis C. Sexually transmitted infections (STIs)   You should be screened each year for STIs, including gonorrhea and chlamydia, if: ? You are sexually active and are younger than 40 years of age. ? You are older than 40 years of age and your health care provider tells you that you are at risk for this type of infection. ? Your sexual activity has changed since you were last screened, and you are at increased risk for chlamydia or gonorrhea. Ask your health care provider if you are at risk.  Ask your health care provider about whether you are at high risk for HIV. Your health care provider may recommend a prescription medicine to help prevent HIV infection. If you choose to take medicine to prevent HIV, you should first get tested for HIV. You should then be tested every 3 months for as long as you are taking the medicine. Follow these instructions at home: Lifestyle  Do not use any products that contain nicotine or tobacco, such as cigarettes, e-cigarettes, and chewing tobacco. If you need help quitting, ask your health care provider.  Do not use street drugs.  Do not share needles.  Ask your health care provider for help if you need support or information about quitting drugs. Alcohol use  Do not drink alcohol if your health care provider tells you not to drink.  If you drink alcohol: ? Limit how much you have to 0-2 drinks a day. ? Be aware of how much alcohol is in your drink. In the [place] one drink equals one 12 oz bottle of beer (355 mL), one 5 oz glass of wine (148 mL), or one 1 oz glass of hard liquor (44 mL). General instructions  Schedule regular health, dental, and eye exams.  Stay current with your vaccines.  Tell your health care provider if: ? You often feel depressed. ? You have ever been abused or do not feel safe at home. Summary  Adopting a healthy lifestyle and getting preventive care are important in promoting health and wellness.  Follow your health care provider's instructions about healthy diet, exercising,  and getting tested or screened for diseases.  Follow your health care provider's instructions on monitoring your cholesterol and blood pressure. This information is not intended to replace advice given to you by your health care provider. Make sure you discuss any questions you have with your health care provider. Document Released: 11/04/2007 Document Revised: 05/01/2018 Document Reviewed: 05/01/2018 Elsevier Patient Education  2020 [employer]   DASH Eating Plan DASH stands for "Dietary Approaches to Stop Hypertension." The DASH eating plan is a healthy eating plan that has been shown to reduce high blood pressure (hypertension). It may also reduce your risk for type 2 diabetes, heart disease, and stroke. The DASH eating plan may also help with weight loss. What are tips for following this plan?  General guidelines  Avoid eating more than 2,300 mg (milligrams) of salt (sodium) a day. If you have hypertension, you may need to reduce your sodium intake to 1,500 mg a day.  Limit alcohol intake to no  more than 1 drink a day for nonpregnant women and 2 drinks a day for men. One drink equals 12 oz of beer, 5 oz of wine, or 1 oz of hard liquor.  Work with your health care provider to maintain a healthy body weight or to lose weight. Ask what an ideal weight is for you.  Get at least 30 minutes of exercise that causes your heart to beat faster (aerobic exercise) most days of the week. Activities may include walking, swimming, or biking.  Work with your health care provider or diet and nutrition specialist (dietitian) to adjust your eating plan to your individual calorie needs. Reading food labels   Check food labels for the amount of sodium per serving. Choose foods with less than 5 percent of the Daily Value of sodium. Generally, foods with less than 300 mg of sodium per serving fit into this eating plan.  To find whole grains, look for the word "whole" as the first word in the ingredient  list. Shopping  Buy products labeled as "low-sodium" or "no salt added."  Buy fresh foods. Avoid canned foods and premade or frozen meals. Cooking  Avoid adding salt when cooking. Use salt-free seasonings or herbs instead of table salt or sea salt. Check with your health care provider or pharmacist before using salt substitutes.  Do not fry foods. Cook foods using healthy methods such as baking, boiling, grilling, and broiling instead.  Cook with heart-healthy oils, such as olive, canola, soybean, or sunflower oil. Meal planning  Eat a balanced diet that includes: ? 5 or more servings of fruits and vegetables each day. At each meal, try to fill half of your plate with fruits and vegetables. ? Up to 6-8 servings of whole grains each day. ? Less than 6 oz of lean meat, poultry, or fish each day. A 3-oz serving of meat is about the same size as a deck of cards. One egg equals 1 oz. ? 2 servings of low-fat dairy each day. ? A serving of nuts, seeds, or beans 5 times each week. ? Heart-healthy fats. Healthy fats called Omega-3 fatty acids are found in foods such as flaxseeds and coldwater fish, like sardines, salmon, and mackerel.  Limit how much you eat of the following: ? Canned or prepackaged foods. ? Food that is high in trans fat, such as fried foods. ? Food that is high in saturated fat, such as fatty meat. ? Sweets, desserts, sugary drinks, and other foods with added sugar. ? Full-fat dairy products.  Do not salt foods before eating.  Try to eat at least 2 vegetarian meals each week.  Eat more home-cooked food and less restaurant, buffet, and fast food.  When eating at a restaurant, ask that your food be prepared with less salt or no salt, if possible. What foods are recommended? The items listed may not be a complete list. Talk with your dietitian about what dietary choices are best for you. Grains Whole-grain or whole-wheat bread. Whole-grain or whole-wheat pasta. Brown  rice. [place]. Bulgur. Whole-grain and low-sodium cereals. Pita bread. Low-fat, low-sodium crackers. Whole-wheat flour tortillas. Vegetables Fresh or frozen vegetables (raw, steamed, roasted, or grilled). Low-sodium or reduced-sodium tomato and vegetable juice. Low-sodium or reduced-sodium tomato sauce and tomato paste. Low-sodium or reduced-sodium canned vegetables. Fruits All fresh, dried, or frozen fruit. Canned fruit in natural juice (without added sugar). Meat and other protein foods Skinless chicken or [place]. Ground chicken or [place]. Pork with fat trimmed off. Fish and seafood.  Egg whites. Dried beans, peas, or lentils. Unsalted nuts, nut butters, and seeds. Unsalted canned beans. Lean cuts of beef with fat trimmed off. Low-sodium, lean deli meat. Dairy Low-fat (1%) or fat-free (skim) milk. Fat-free, low-fat, or reduced-fat cheeses. Nonfat, low-sodium ricotta or cottage cheese. Low-fat or nonfat yogurt. Low-fat, low-sodium cheese. Fats and oils Soft margarine without trans fats. Vegetable oil. Low-fat, reduced-fat, or light mayonnaise and salad dressings (reduced-sodium). Canola, safflower, olive, soybean, and sunflower oils. Avocado. Seasoning and other foods Herbs. Spices. Seasoning mixes without salt. Unsalted popcorn and pretzels. Fat-free sweets. What foods are not recommended? The items listed may not be a complete list. Talk with your dietitian about what dietary choices are best for you. Grains Baked goods made with fat, such as croissants, muffins, or some breads. Dry pasta or rice meal packs. Vegetables Creamed or fried vegetables. Vegetables in a cheese sauce. Regular canned vegetables (not low-sodium or reduced-sodium). Regular canned tomato sauce and paste (not low-sodium or reduced-sodium). Regular tomato and vegetable juice (not low-sodium or reduced-sodium). Rosita FirePickles. Olives. Fruits Canned fruit in a light or heavy syrup. Fried fruit. Fruit in cream or butter sauce.  Meat and other protein foods Fatty cuts of meat. Ribs. Fried meat. Tomasa BlaseBacon. Sausage. Bologna and other processed lunch meats. Salami. Fatback. Hotdogs. Bratwurst. Salted nuts and seeds. Canned beans with added salt. Canned or smoked fish. Whole eggs or egg yolks. Chicken or [place] with skin. Dairy Whole or 2% milk, cream, and half-and-half. Whole or full-fat cream cheese. Whole-fat or sweetened yogurt. Full-fat cheese. Nondairy creamers. Whipped toppings. Processed cheese and cheese spreads. Fats and oils Butter. Stick margarine. Lard. Shortening. Ghee. Bacon fat. Tropical oils, such as coconut, palm kernel, or palm oil. Seasoning and other foods Salted popcorn and pretzels. Onion salt, garlic salt, seasoned salt, table salt, and sea salt. Worcestershire sauce. Tartar sauce. Barbecue sauce. Teriyaki sauce. Soy sauce, including reduced-sodium. Steak sauce. Canned and packaged gravies. Fish sauce. Oyster sauce. Cocktail sauce. Horseradish that you find on the shelf. Ketchup. Mustard. Meat flavorings and tenderizers. Bouillon cubes. Hot sauce and Tabasco sauce. Premade or packaged marinades. Premade or packaged taco seasonings. Relishes. Regular salad dressings. Where to find more information:  National Heart, Lung, and Blood Institute: PopSteam.iswww.nhlbi.nih.gov  American Heart Association: www.heart.org Summary  The DASH eating plan is a healthy eating plan that has been shown to reduce high blood pressure (hypertension). It may also reduce your risk for type 2 diabetes, heart disease, and stroke.  With the DASH eating plan, you should limit salt (sodium) intake to 2,300 mg a day. If you have hypertension, you may need to reduce your sodium intake to 1,500 mg a day.  When on the DASH eating plan, aim to eat more fresh fruits and vegetables, whole grains, lean proteins, low-fat dairy, and heart-healthy fats.  Work with your health care provider or diet and nutrition specialist (dietitian) to adjust your  eating plan to your individual calorie needs. This information is not intended to replace advice given to you by your health care provider. Make sure you discuss any questions you have with your health care provider. Document Released: 04/27/2011 Document Revised: 04/20/2017 Document Reviewed: 05/01/2016 Elsevier Patient Education  2020 [employer]

## 2019-04-11 NOTE — Progress Notes (Signed)
Subjective:    Patient ID: Roberto Mason, male    DOB: 01-03-1981, 38 y.o.   MRN: [id number]  Patient presents today for complete physical   HPI  Tobacco use: Continuous, he is not ready to quit. He is aware of different aid available. He is aware of potential complications from tobacco use (cancer, COPD, uncontrolled HTN, MI, CVA, and PAD).  HTN: Stable with propanolol He reports fatigue and daytime somnolence has resolved, hence he canceled appt with neurology. Admits you high fat/carb and sodium diet BP Readings from Last 3 Encounters:  04/11/19 (!) 136/94  01/08/19 134/78  12/23/18 122/68   Elevated ALP: He denies any GI symptoms: nausea, ABD pain, diarrhea, weight loss.  Sexual History (orientation,birth control, marital status, STD):seperated from wife, not sexually active at this time.  Depression/Suicide: Depression screen [facility] 2/9 04/11/2019 11/19/2018 10/03/2018  Decreased Interest 0 2 0  Down, Depressed, Hopeless 0 0 0  PHQ - 2 Score 0 2 0  Altered sleeping - 0 -  Tired, decreased energy - 3 -  Change in appetite - 0 -  Feeling bad or failure about yourself  - 0 -  Trouble concentrating - 0 -  Moving slowly or fidgety/restless - 0 -  Suicidal thoughts - 0 -  PHQ-9 Score - 5 -   Vision:will schedule, hx of glaucoma  Dental:up to date  Immunizations: (TDAP, Hep C screen, Pneumovax, Influenza, zoster)  Health Maintenance  Topic Date Due  . Flu Shot  08/20/2019*  . Tetanus Vaccine  01/17/2026  . HIV Screening  Completed  *Topic was postponed. The date shown is not the original due date.   Diet:high fat and high carb.  Weight:  Wt Readings from Last 3 Encounters:  04/11/19 211 lb 6.4 oz (95.9 kg)  01/08/19 196 lb 6.4 oz (89.1 kg)  12/23/18 195 lb 3.2 oz (88.5 kg)   Exercise:none due to work schedule  Fall Risk: Fall Risk  04/11/2019 10/03/2018  Falls in the past year? 0 0   Advanced Directive: Advanced Directives 11/16/2016  Does Patient Have a  Medical Advance Directive? No  Would patient like information on creating a medical advance directive? No - Patient declined    Medications and allergies reviewed with patient and updated if appropriate.  Patient Active Problem List   Diagnosis Date Noted  . Vitamin D insufficiency 04/11/2019  . Chronic fatigue 04/11/2019  . Hyperglycemia 04/11/2019  . Elevated alkaline phosphatase level 12/24/2018  . Graves disease 10/23/2018  . Hyperthyroidism 10/08/2018  . 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 Enlarged thyroid 10/08/2018  . Dyspnea on exertion 10/08/2018  . Chronic cough 10/03/2018  . Gastroesophageal reflux disease without esophagitis 10/03/2018  . Acute right-sided low back pain without sciatica 11/16/2017  . Subungual hematoma of finger of left hand 01/26/2017  . Rash 01/26/2017  . Tobacco abuse 01/27/2016  . 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 methimazole (TAPAZOLE) 5 MG tablet Take 4 tablets (20 mg total) by mouth daily. MUST CALL OFFICE TO SCHEDULE APPT. WILL PROVIDE 30 DAY SUPPLY 120 tablet 0  . propranolol (INDERAL) 80 MG tablet Take 1 tablet (80 mg total) by mouth at bedtime. 90 tablet 3   No current facility-administered medications on file prior to visit.     Past Medical History:  Diagnosis Date  . Allergy   . Cold 01/25/2016  pt states x 2 weeks with lingering cough about 10x day-  no fever, nonproductive  . GERD (gastroesophageal reflux disease)   . PONV (postoperative nausea and vomiting)   . 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 Tobacco abuse     Past Surgical History:  Procedure Laterality Date  . FISTULA PLUG N/A 01/27/2016   Procedure: REPAIR OF PERIRECTAL FISTULA intersphincteric fistula;  Surgeon: Karie Soda, MD;  Location: [facility];  Service: General;  Laterality: N/A;  .  IRRIGATION AND DEBRIDEMENT ABSCESS N/A 09/22/2015   Procedure: anoscope IRRIGATION AND DEBRIDEMENT ABSCESS;  Surgeon: Claud Kelp, MD;  Location: [facility];  Service: General;  Laterality: N/A;  . KNEE ARTHROSCOPY Right   . MOUTH SURGERY    . RECTAL EXAM UNDER ANESTHESIA N/A 01/27/2016   Procedure: RECTAL EXAM UNDER ANESTHESIA;  Surgeon: Karie Soda, MD;  Location: [facility];  Service: General;  Laterality: N/A;    Social History   Socioeconomic History  . Marital status: Legally Separated    Spouse name: Not on file  . Number of children: 2  . Years of education: Not on file  . Highest education level: Not on file  Occupational History  . Not on file  Social Needs  . Financial resource strain: Not on file  . Food insecurity    Worry: Not on file    Inability: Not on file  . Transportation needs    Medical: Not on file    Non-medical: Not on file  Tobacco Use  . Smoking status: Current Every Day Smoker    Packs/day: 1.00    Years: 20.00    Pack years: 20.00    Types: Cigarettes  . Smokeless tobacco: Former Neurosurgeon    Types: Chew  . Tobacco comment: will like to try zyban, use of nicotine patch and chnatix in past  Substance and Sexual Activity  . Alcohol use: No  . Drug use: No  . Sexual activity: Not Currently    Birth control/protection: None  Lifestyle  . Physical activity    Days per week: Not on file    Minutes per session: Not on file  . Stress: Not on file  Relationships  . Social Musician on phone: Not on file    Gets together: Not on file    Attends religious service: Not on file    Active member of club or organization: Not on file    Attends meetings of clubs or organizations: Not on file    Relationship status: Not on file  Other Topics Concern  . Not on file  Social History Narrative  . Not on file    Family History  Problem Relation Age of Onset  . Glaucoma Mother   . Heart disease Father   . Hyperlipidemia Father   . Hypertension  Father   . Heart failure Father   . Hyperthyroidism Father   . Diabetes Maternal Grandmother   . Glaucoma Maternal Grandmother   . Diabetes Maternal Grandfather   . Diabetes Paternal Grandmother   . Hyperthyroidism Paternal Grandmother   . Diabetes Paternal Grandfather   . COPD Paternal Grandfather   . Lung cancer Paternal Grandfather        Review of Systems  Constitutional: Negative for fever, malaise/fatigue and weight loss.  HENT: Negative for congestion and sore throat.   Eyes:       Negative for visual changes  Respiratory: Negative for cough and shortness  of breath.   Cardiovascular: Negative for chest pain, palpitations and leg swelling.  Gastrointestinal: Negative for blood in stool, constipation, diarrhea and heartburn.  Genitourinary: Negative for dysuria, frequency and urgency.  Musculoskeletal: Negative for falls, joint pain and myalgias.  Skin: Negative for rash.  Neurological: Negative for dizziness, sensory change and headaches.  Endo/Heme/Allergies: Does not bruise/bleed easily.  Psychiatric/Behavioral: Negative for depression, substance abuse and suicidal ideas. The patient is not nervous/anxious.    Objective:   Vitals:   04/11/19 0839  BP: (!) 136/94  Pulse: 70  Temp: (!) 97.4 F (36.3 C)  SpO2: 98%   Body mass index is 28.67 kg/m.  Physical Examination:  Physical Exam Vitals signs reviewed. Exam conducted with a chaperone present.  Constitutional:      General: He is not in acute distress.    Appearance: He is obese.  HENT:     Right Ear: Tympanic membrane, ear canal and external ear normal.     Left Ear: Tympanic membrane, ear canal and external ear normal.  Eyes:     General: No scleral icterus.    Extraocular Movements: Extraocular movements intact.     Conjunctiva/sclera: Conjunctivae normal.  Neck:     Musculoskeletal: Normal range of motion and neck supple.     Thyroid: Thyromegaly present. No thyroid mass or thyroid tenderness.   Cardiovascular:     Rate and Rhythm: Normal rate and regular rhythm.     Pulses: Normal pulses.     Heart sounds: Normal heart sounds.  Pulmonary:     Effort: Pulmonary effort is normal.     Breath sounds: Normal breath sounds.  Chest:     Chest wall: No tenderness.  Abdominal:     General: Bowel sounds are normal. There is no distension.     Palpations: Abdomen is soft.     Tenderness: There is no abdominal tenderness.  Genitourinary:    Prostate: Normal.     Rectum: Normal. Guaiac result negative.  Musculoskeletal: Normal range of motion.        General: No tenderness.  Lymphadenopathy:     Cervical: No cervical adenopathy.  Skin:    General: Skin is warm and dry.  Neurological:     Mental Status: He is alert and oriented to person, place, and time.  Psychiatric:        Mood and Affect: Mood normal.        Behavior: Behavior normal.        Thought Content: Thought content normal.    ASSESSMENT and PLAN:  Dontee was seen today for annual exam.  Diagnoses and all orders for this visit:  Encounter for preventative adult health care exam with abnormal findings -     IFOBT POC (occult bld, rslt in office)  Encounter for lipid screening for cardiovascular disease -     HTN_4 Lipid panel  Hyperglycemia -     DM_1 Hemoglobin A1c  Vitamin D insufficiency -     Vitamin D 1,25 dihydroxy -     cholecalciferol (VITAMIN D3) 25 MCG (1000 UT) tablet; Take 1 tablet (1,000 Units total) by mouth daily.  Encounter for screening fecal occult blood testing -     IFOBT POC (occult bld, rslt in office)  Chronic fatigue -     vitamin B-12 (CYANOCOBALAMIN) 100 MCG tablet; Take 1 tablet (100 mcg total) by mouth daily.  Hypertension due to endocrine disorder  Gastroesophageal reflux disease without esophagitis -     esomeprazole (NEXIUM) 20  MG capsule; Take 1 capsule (20 mg total) by mouth daily at 12 noon.  Elevated alkaline phosphatase level -     Hepatic function panel -      Ambulatory referral to Gastroenterology  Tobacco abuse   Tobacco abuse We discussed the importance of quitting and possible complications.   Hypertension due to endocrine disorder Stable with propanolol. Stable with propanolol He reports fatigue and daytime somnolence has resolved, hence he canceled appt with neurology. Admits you high fat/carb and sodium diet BP Readings from Last 3 Encounters:  04/11/19 (!) 136/94  01/08/19 134/78  12/23/18 122/68   Advised about the importance of DASH diet and adequate oral hydration with water.  Elevated alkaline phosphatase level Continuous elevation since 12/2018: 174-197 Denies any GI symptoms or joint pain. No signs of jaundice. Normal calcium level Entered referral to GI     Problem List Items Addressed This Visit      Cardiovascular and Mediastinum   Hypertension due to endocrine disorder    Stable with propanolol. Stable with propanolol He reports fatigue and daytime somnolence has resolved, hence he canceled appt with neurology. Admits you high fat/carb and sodium diet BP Readings from Last 3 Encounters:  04/11/19 (!) 136/94  01/08/19 134/78  12/23/18 122/68   Advised about the importance of DASH diet and adequate oral hydration with water.        Digestive   Gastroesophageal reflux disease without esophagitis   Relevant Medications   esomeprazole (NEXIUM) 20 MG capsule     Other   Chronic fatigue   Relevant Medications   vitamin B-12 (CYANOCOBALAMIN) 100 MCG tablet   Elevated alkaline phosphatase level    Continuous elevation since 12/2018: 174-197 Denies any GI symptoms or joint pain. No signs of jaundice. Normal calcium level Entered referral to GI      Relevant Orders   Hepatic function panel (Completed)   Ambulatory referral to Gastroenterology   Hyperglycemia   Relevant Orders   DM_1 Hemoglobin A1c (Completed)   Tobacco abuse    We discussed the importance of quitting and possible complications.        Vitamin D insufficiency   Relevant Medications   cholecalciferol (VITAMIN D3) 25 MCG (1000 UT) tablet   Other Relevant Orders   Vitamin D 1,25 dihydroxy    Other Visit Diagnoses    Encounter for preventative adult health care exam with abnormal findings    -  Primary   Relevant Orders   IFOBT POC (occult bld, rslt in office) (Completed)   Encounter for lipid screening for cardiovascular disease       Relevant Orders   HTN_4 Lipid panel (Completed)   Encounter for screening fecal occult blood testing       Relevant Orders   IFOBT POC (occult bld, rslt in office) (Completed)      Follow up: Return in about 3 months (around 07/12/2019) for HTN (virtual, 30mins).  Alysia Pennaharlotte Khadejah Son, NP

## 2019-04-12 ENCOUNTER — Encounter: Payer: Self-pay | Admitting: Nurse Practitioner

## 2019-04-12 NOTE — Assessment & Plan Note (Signed)
We discussed the importance of quitting and possible complications.

## 2019-04-12 NOTE — Assessment & Plan Note (Signed)
Continuous elevation since 12/2018: 174-197 Denies any GI symptoms or joint pain. No signs of jaundice. Normal calcium level Entered referral to GI

## 2019-04-12 NOTE — Assessment & Plan Note (Signed)
Stable with propanolol. Stable with propanolol He reports fatigue and daytime somnolence has resolved, hence he canceled appt with neurology. Admits you high fat/carb and sodium diet BP Readings from Last 3 Encounters:  04/11/19 (!) 136/94  01/08/19 134/78  12/23/18 122/68   Advised about the importance of DASH diet and adequate oral hydration with water.

## 2019-04-14 ENCOUNTER — Telehealth: Payer: Self-pay

## 2019-04-14 LAB — VITAMIN D 1,25 DIHYDROXY
Vitamin D 1, 25 (OH)2 Total: 79 pg/mL — ABNORMAL HIGH (ref 18–72)
Vitamin D2 1, 25 (OH)2: <8 pg/mL
Vitamin D3 1, 25 (OH)2: 79 pg/mL

## 2019-04-14 NOTE — Telephone Encounter (Signed)
Pt is aware, GI referral due to elevated ALP.

## 2019-04-14 NOTE — Telephone Encounter (Signed)
Copied from [place] [id number]. Topic: General - Other >> Apr 14, 2019 10:44 AM Ivar Drape wrote: Reason for CRM:  Pt would like a return call to discuss his GI referral.  He stated he didn't know a GI visit was necessary.  He would like to have it explained to him.

## 2019-04-16 ENCOUNTER — Ambulatory Visit (INDEPENDENT_AMBULATORY_CARE_PROVIDER_SITE_OTHER): Payer: PRIVATE HEALTH INSURANCE | Admitting: Internal Medicine

## 2019-04-16 ENCOUNTER — Other Ambulatory Visit: Payer: Self-pay

## 2019-04-16 ENCOUNTER — Encounter: Payer: Self-pay | Admitting: Internal Medicine

## 2019-04-16 VITALS — BP 138/78 | HR 80 | Temp 98.2°F | Ht 72.0 in | Wt 213.2 lb

## 2019-04-16 DIAGNOSIS — E0590 Thyrotoxicosis, unspecified without thyrotoxic crisis or storm: Secondary | ICD-10-CM

## 2019-04-16 DIAGNOSIS — R748 Abnormal levels of other serum enzymes: Secondary | ICD-10-CM

## 2019-04-16 DIAGNOSIS — I152 Hypertension secondary to endocrine disorders: Secondary | ICD-10-CM | POA: Diagnosis not present

## 2019-04-16 DIAGNOSIS — R000 Tachycardia, unspecified: Secondary | ICD-10-CM

## 2019-04-16 LAB — TSH: TSH: 0.06 u[IU]/mL — ABNORMAL LOW (ref 0.35–4.50)

## 2019-04-16 LAB — T4, FREE: Free T4: 0.93 ng/dL (ref 0.60–1.60)

## 2019-04-16 MED ORDER — PROPRANOLOL HCL 80 MG PO TABS: 40.0000 mg | ORAL_TABLET | Freq: Every day | ORAL | 0 refills | Status: DC

## 2019-04-16 NOTE — Patient Instructions (Addendum)
-   We recommend that you follow these hyperthyroidism instructions at home:  1) Take Methimazole 5 mg, TWO tablets   If you develop severe sore throat with high fevers OR develop unexplained yellowing of your skin, eyes, under your tongue, severe abdominal pain with nausea or vomiting --> then please get evaluated immediately.  2) Propranolol 80 mg, HALF a tablet  daily  3) Get repeat thyroid labs today and in 6 weeks .   It is ESSENTIAL to get follow-up labs to help avoid over or undertreatment of your hyperthyroidism - both of which can be dangerous to your health.

## 2019-04-16 NOTE — Progress Notes (Signed)
Name: Roberto Mason  MRN/ DOB: [id number], 03/10/1981    Age/ Sex: 38 y.o., male     PCP: Nche, Roberto Brooke, NP   Reason for Endocrinology Evaluation: Hyperthyroidism     Initial Endocrinology Clinic Visit: 10/23/18    PATIENT IDENTIFIER: Roberto Mason is a 38 y.o., male with a past medical history of GERD. He has followed with [facility] since 10/23/18 for consultative assistance with management of his Hyperthyroidism  HISTORICAL SUMMARY: The patient presented to his PCP in May, 2020 with c/o sob that started a month prior to his presentation associated with palpitations. He was found to have a suppressed TSH  At <0.01 uIU/mL . Repeat TFT's confirmed hyperthyroidism diagnosis with suppressed TSH and elevated FT4 at 3.3 ng/dL.    He was started on Methimazole 15 mg daily in May, 2020  Father and paternal grandmother with hyperthyroidism  SUBJECTIVE:   During last visit (12/23/18): We increased methimazole   Today (04/16/2019):  [name] is here for a 3 month follow up on his hyperthyroidism.    Today he is c/o weight gain, but denies palpitations, tremors or anxiety   He did have a transient period of neck enlargement and irritation   No biotin intake  Denies any nausea or diarrhea.   Methimazole 5 mg BID      ROS:  As per HPI.   HISTORY:  Past Medical History:  Past Medical History:  Diagnosis Date  . Allergy   . Cold 01/25/2016   pt states x 2 weeks with lingering cough about 10x day-  no fever, nonproductive  . GERD (gastroesophageal reflux disease)   . PONV (postoperative nausea and vomiting)   . 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 Tobacco abuse    Past Surgical History:  Past Surgical History:  Procedure Laterality Date  . FISTULA PLUG N/A 01/27/2016   Procedure: REPAIR OF PERIRECTAL FISTULA intersphincteric fistula;  Surgeon: Michael Boston, MD;  Location: [facility];  Service: General;  Laterality: N/A;   . IRRIGATION AND DEBRIDEMENT ABSCESS N/A 09/22/2015   Procedure: anoscope IRRIGATION AND DEBRIDEMENT ABSCESS;  Surgeon: Fanny Skates, MD;  Location: [facility];  Service: General;  Laterality: N/A;  . KNEE ARTHROSCOPY Right   . MOUTH SURGERY    . RECTAL EXAM UNDER ANESTHESIA N/A 01/27/2016   Procedure: RECTAL EXAM UNDER ANESTHESIA;  Surgeon: Michael Boston, MD;  Location: [facility];  Service: General;  Laterality: N/A;    Social History:  reports that he has been smoking cigarettes. He has a 20.00 pack-year smoking history. He has quit using smokeless tobacco.  His smokeless tobacco use included chew. He reports that he does not drink alcohol or use drugs. Family History:  Family History  Problem Relation Age of Onset  . Glaucoma Mother   . Heart disease Father   . Hyperlipidemia Father   . Hypertension Father   . Heart failure Father   . Hyperthyroidism Father   . Diabetes Maternal Grandmother   . Glaucoma Maternal Grandmother   . Diabetes Maternal Grandfather   . Diabetes Paternal Grandmother   . Hyperthyroidism Paternal Grandmother   . Diabetes Paternal Grandfather   . COPD Paternal Grandfather   . Lung cancer Paternal Grandfather      HOME MEDICATIONS: Allergies as of 04/16/2019      Reactions   Oxycodone Nausea Only      Medication List       Accurate as  of April 16, 2019  2:51 PM. If you have any questions, ask your nurse or doctor.        cholecalciferol 25 MCG (1000 UT) tablet Commonly known as: VITAMIN D3 Take 1 tablet (1,000 Units total) by mouth daily.   esomeprazole 20 MG capsule Commonly known as: NexIUM Take 1 capsule (20 mg total) by mouth daily at 12 noon.   methimazole 5 MG tablet Commonly known as: TAPAZOLE Take 4 tablets (20 mg total) by mouth daily. MUST CALL OFFICE TO SCHEDULE APPT. WILL PROVIDE 30 DAY SUPPLY   propranolol 80 MG tablet Commonly known as: INDERAL Take 0.5 tablets (40 mg total) by mouth at bedtime. What changed: how much to take  Changed by: Scarlette ShortsIbtehal J Dareen Gutzwiller, MD   vitamin B-12 100 MCG tablet Commonly known as: CYANOCOBALAMIN Take 1 tablet (100 mcg total) by mouth daily.         OBJECTIVE:   PHYSICAL EXAM: VS: BP 138/78 (BP Location: Left Arm, Patient Position: Sitting, Cuff Size: Large)   Pulse 80   Temp 98.2 F (36.8 C)   Ht 6' (1.829 m)   Wt 213 lb 3.2 oz (96.7 kg)   SpO2 96%   BMI 28.92 kg/m    EXAM: General: Pt appears well and is in NAD  Eyes: External eye exam normal without stare, lid lag or exophthalmos.  EOM intact.   Neck: General: Supple without adenopathy. Thyroid: Thyroid is prominent.  No nodules appreciated. No thyroid bruit.  Lungs: Clear with good BS bilat with no rales, rhonchi, or wheezes  Heart: Auscultation: RRR.  Abdomen: Normoactive bowel sounds, soft, nontender, without masses or organomegaly palpable  Extremities:  BL LE: No pretibial edema normal ROM and strength.  Mental Status: Judgment, insight: Intact Orientation: Oriented to time, place, and person Mood and affect: No depression, anxiety, or agitation     DATA REVIEWED:  Results for Roberto Mason, Roberto Mason (MRN [id number]) as of 04/16/2019 14:51  Ref. Range 04/11/2019 09:38 04/16/2019 10:50  Alkaline Phosphatase Latest Ref Range: 39 - 117 U/L 192 (H)   Albumin Latest Ref Range: 3.5 - 5.2 g/dL 4.8   AST Latest Ref Range: 0 - 37 U/L 17   ALT Latest Ref Range: 0 - 53 U/L 18   Total Protein Latest Ref Range: 6.0 - 8.3 g/dL 7.3   Bilirubin, Direct Latest Ref Range: 0.0 - 0.3 mg/dL 0.2   Total Bilirubin Latest Ref Range: 0.2 - 1.2 mg/dL 1.0   Total CHOL/HDL Ratio Unknown 4   Cholesterol Latest Ref Range: 0 - 200 mg/dL 782175   HDL Cholesterol Latest Ref Range: >39.00 mg/dL 95.6241.20   LDL (calc) Latest Ref Range: 0 - 99 mg/dL 130124 (H)   NonHDL Unknown 133.94   Triglycerides Latest Ref Range: 0.0 - 149.0 mg/dL 86.552.0   VLDL Latest Ref Range: 0.0 - 40.0 mg/dL 78.410.4   Vitamin D 1, 25 (OH) Total Latest Ref Range: 18 - 72 pg/mL  79 (H)   Vitamin D2 1, 25 (OH) Latest Units: pg/mL <8   Vitamin D3 1, 25 (OH) Latest Units: pg/mL 79   Hemoglobin A1C Latest Ref Range: 4.6 - 6.5 % 5.8   TSH Latest Ref Range: 0.35 - 4.50 uIU/mL  0.06 (L)  T4,Free(Direct) Latest Ref Range: 0.60 - 1.60 ng/dL  6.960.93    ASSESSMENT / PLAN / RECOMMENDATIONS:   1. Hyperthyroidism Secondary to Graves' Disease:    Plan:  Clinically he is euthyroid but repeat labs shows hyperthyroidism again.  He  assures me compliance ?  Medications   Increase Methimazole 5 mg, 3 tabs daily    2. Graves' Disease:   - No extra-thyroidal manifestations of graves' disease  3. Elevated Alkaline Phosphatase:   - Pt asymptomatic  - Most likely due to methimazole, which causes a cholestatic picture - Will continue to monitor , will consider alternative therapy if its 3x-5x upper limit of normal.    Labs in 6 weeks    F/u in 3 months    Signed electronically by: Lyndle Herrlich, MD  [facility]  [facility] [address] [place] [place], [place] [address] Phone: [phone] FAX: [phone]      CC: Anne Ng, NP [address] [place] [address] Phone: [phone]  Fax: [phone]   Return to Endocrinology clinic as below: Future Appointments  Date Time Provider Department Center  07/17/2019  9:30 AM Kierstynn Babich, Konrad Dolores, MD [id number] None

## 2019-06-28 ENCOUNTER — Other Ambulatory Visit: Payer: Self-pay | Admitting: Internal Medicine

## 2019-06-28 DIAGNOSIS — E0590 Thyrotoxicosis, unspecified without thyrotoxic crisis or storm: Secondary | ICD-10-CM

## 2019-06-28 DIAGNOSIS — E049 Nontoxic goiter, unspecified: Secondary | ICD-10-CM

## 2019-07-15 ENCOUNTER — Other Ambulatory Visit: Payer: Self-pay

## 2019-07-17 ENCOUNTER — Other Ambulatory Visit: Payer: Self-pay

## 2019-07-17 ENCOUNTER — Ambulatory Visit: Payer: PRIVATE HEALTH INSURANCE | Admitting: Internal Medicine

## 2019-07-17 ENCOUNTER — Encounter: Payer: Self-pay | Admitting: Internal Medicine

## 2019-07-17 VITALS — BP 124/78 | HR 78 | Temp 97.8°F | Ht 72.0 in | Wt 218.6 lb

## 2019-07-17 DIAGNOSIS — E0500 Thyrotoxicosis with diffuse goiter without thyrotoxic crisis or storm: Secondary | ICD-10-CM

## 2019-07-17 DIAGNOSIS — R748 Abnormal levels of other serum enzymes: Secondary | ICD-10-CM | POA: Diagnosis not present

## 2019-07-17 DIAGNOSIS — E0590 Thyrotoxicosis, unspecified without thyrotoxic crisis or storm: Secondary | ICD-10-CM | POA: Diagnosis not present

## 2019-07-17 DIAGNOSIS — E049 Nontoxic goiter, unspecified: Secondary | ICD-10-CM

## 2019-07-17 LAB — T4, FREE: Free T4: 1.32 ng/dL (ref 0.60–1.60)

## 2019-07-17 LAB — TSH: TSH: <0.01 u[IU]/mL — ABNORMAL LOW (ref 0.35–4.50)

## 2019-07-17 LAB — COMPREHENSIVE METABOLIC PANEL
ALT: 19 U/L (ref 0–53)
AST: 15 U/L (ref 0–37)
Albumin: 4.2 g/dL (ref 3.5–5.2)
Alkaline Phosphatase: 159 U/L — ABNORMAL HIGH (ref 39–117)
BUN: 12 mg/dL (ref 6–23)
CO2: 29 mEq/L (ref 19–32)
Calcium: 9.4 mg/dL (ref 8.4–10.5)
Chloride: 104 mEq/L (ref 96–112)
Creatinine, Ser: 0.94 mg/dL (ref 0.40–1.50)
GFR: 88.42 mL/min (ref >60.00)
Glucose, Bld: 80 mg/dL (ref 70–99)
Potassium: 4.1 mEq/L (ref 3.5–5.1)
Sodium: 139 mEq/L (ref 135–145)
Total Bilirubin: 0.8 mg/dL (ref 0.2–1.2)
Total Protein: 6.8 g/dL (ref 6.0–8.3)

## 2019-07-17 MED ORDER — METHIMAZOLE 5 MG PO TABS: 15.0000 mg | ORAL_TABLET | Freq: Every day | ORAL | 0 refills | Status: DC

## 2019-07-17 NOTE — Progress Notes (Signed)
Name: Roberto Mason  MRN/ DOB: [id number], 1980-07-02    Age/ Sex: 39 y.o., male     PCP: Nche, Charlene Brooke, NP   Reason for Endocrinology Evaluation: Hyperthyroidism     Initial Endocrinology Clinic Visit: 10/23/18    PATIENT IDENTIFIER: Roberto Mason is a 39 y.o., male with a past medical history of GERD. He has followed with [facility] since 10/23/18 for consultative assistance with management of his Hyperthyroidism  HISTORICAL SUMMARY: The patient presented to his PCP in May, 2020 with c/o sob that started a month prior to his presentation associated with palpitations. He was found to have a suppressed TSH  At <0.01 uIU/mL . Repeat TFT's confirmed hyperthyroidism diagnosis with suppressed TSH and elevated FT4 at 3.3 ng/dL.    He was started on Methimazole 15 mg daily in May, 2020  Father and paternal grandmother with hyperthyroidism  SUBJECTIVE:   During last visit (04/16/19): We increased methimazole   Today (07/17/2019):  Roberto Mason is here for a 3 month follow up on his hyperthyroidism.    He continues with weight gain  He did have a transient period of neck enlargement and irritation   Denies any vomiting or diarrhea.  Feels eyes bulged at times but no burning or itching  Methimazole 5 mg TID     ROS:  As per HPI.   HISTORY:  Past Medical History:  Past Medical History:  Diagnosis Date  . Allergy   . Cold 01/25/2016   pt states x 2 weeks with lingering cough about 10x day-  no fever, nonproductive  . GERD (gastroesophageal reflux disease)   . PONV (postoperative nausea and vomiting)   . 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 Tobacco abuse    Past Surgical History:  Past Surgical History:  Procedure Laterality Date  . FISTULA PLUG N/A 01/27/2016   Procedure: REPAIR OF PERIRECTAL FISTULA intersphincteric fistula;  Surgeon: Michael Boston, MD;  [facility]: [facility];  Service: General;  Laterality: N/A;  .  IRRIGATION AND DEBRIDEMENT ABSCESS N/A 09/22/2015   Procedure: anoscope IRRIGATION AND DEBRIDEMENT ABSCESS;  Surgeon: Fanny Skates, MD;  [facility]: [facility];  Service: General;  Laterality: N/A;  . KNEE ARTHROSCOPY Right   . MOUTH SURGERY    . RECTAL EXAM UNDER ANESTHESIA N/A 01/27/2016   Procedure: RECTAL EXAM UNDER ANESTHESIA;  Surgeon: Michael Boston, MD;  [facility]: [facility];  Service: General;  Laterality: N/A;    Social History:  reports that he has been smoking cigarettes. He has a 20.00 pack-year smoking history. He has quit using smokeless tobacco.  His smokeless tobacco use included chew. He reports that he does not drink alcohol or use drugs. Family History:  Family History  Problem Relation Age of Onset  . Glaucoma Mother   . Heart disease Father   . Hyperlipidemia Father   . Hypertension Father   . Heart failure Father   . Hyperthyroidism Father   . Diabetes Maternal Grandmother   . Glaucoma Maternal Grandmother   . Diabetes Maternal Grandfather   . Diabetes Paternal Grandmother   . Hyperthyroidism Paternal Grandmother   . Diabetes Paternal Grandfather   . COPD Paternal Grandfather   . Lung cancer Paternal Grandfather      HOME MEDICATIONS: Allergies as of 07/17/2019      Reactions   Oxycodone Nausea Only      Medication List       Accurate as of July 17, 2019  9:01 AM. If you have any questions, ask your nurse or doctor.        cholecalciferol 25 MCG (1000 UNIT) tablet Commonly known as: VITAMIN D3 Take 1 tablet (1,000 Units total) by mouth daily.   esomeprazole 20 MG capsule Commonly known as: NexIUM Take 1 capsule (20 mg total) by mouth daily at 12 noon.   methimazole 5 MG tablet Commonly known as: TAPAZOLE TAKE 2 TABLETS BY MOUTH EVERY DAY   propranolol 80 MG tablet Commonly known as: INDERAL Take 0.5 tablets (40 mg total) by mouth at bedtime.   vitamin B-12 100 MCG tablet Commonly known as: CYANOCOBALAMIN Take 1 tablet (100 mcg total) by mouth  daily.         OBJECTIVE:   PHYSICAL EXAM: VS: There were no vitals taken for this visit.   EXAM: General: Pt appears well and is in NAD  Eyes: External eye exam normal without stare, lid lag or exophthalmos.  EOM intact.   Neck: General: Supple without adenopathy. Thyroid: Thyroid is prominent.  No nodules appreciated. No thyroid bruit.  Lungs: Clear with good BS bilat with no rales, rhonchi, or wheezes  Heart: Auscultation: RRR.  Abdomen: Normoactive bowel sounds, soft, nontender, without masses or organomegaly palpable  Extremities:  BL LE: No pretibial edema normal ROM and strength.  Mental Status: Judgment, insight: Intact Orientation: Oriented to time, place, and person Mood and affect: No depression, anxiety, or agitation     DATA REVIEWED:  Results for Roberto Mason, Roberto Mason (MRN [id number]) as of 04/16/2019 14:51  Ref. Range 04/11/2019 09:38 04/16/2019 10:50  Alkaline Phosphatase Latest Ref Range: 39 - 117 U/L 192 (H)   Albumin Latest Ref Range: 3.5 - 5.2 g/dL 4.8   AST Latest Ref Range: 0 - 37 U/L 17   ALT Latest Ref Range: 0 - 53 U/L 18   Total Protein Latest Ref Range: 6.0 - 8.3 g/dL 7.3   Bilirubin, Direct Latest Ref Range: 0.0 - 0.3 mg/dL 0.2   Total Bilirubin Latest Ref Range: 0.2 - 1.2 mg/dL 1.0   Total CHOL/HDL Ratio Unknown 4   Cholesterol Latest Ref Range: 0 - 200 mg/dL 329   HDL Cholesterol Latest Ref Range: >39.00 mg/dL 92.42   LDL (calc) Latest Ref Range: 0 - 99 mg/dL 683 (H)   NonHDL Unknown 133.94   Triglycerides Latest Ref Range: 0.0 - 149.0 mg/dL 41.9   VLDL Latest Ref Range: 0.0 - 40.0 mg/dL 62.2   Vitamin D 1, 25 (OH) Total Latest Ref Range: 18 - 72 pg/mL 79 (H)   Vitamin D2 1, 25 (OH) Latest Units: pg/mL <8   Vitamin D3 1, 25 (OH) Latest Units: pg/mL 79   Hemoglobin A1C Latest Ref Range: 4.6 - 6.5 % 5.8   TSH Latest Ref Range: 0.35 - 4.50 uIU/mL  0.06 (L)  T4,Free(Direct) Latest Ref Range: 0.60 - 1.60 ng/dL  2.97    ASSESSMENT / PLAN /  RECOMMENDATIONS:   1. Hyperthyroidism Secondary to Graves' Disease:    Clinically  is euthyroid but repeat labs shows continued low TSH with normal free T4.  I would not be making any changes at this point due to concerns of elevated alkaline phosphatase.  He will be advised to take the 3 tablets of methimazole all at the same time.  Medications   Methimazole 5 mg, 3 tabs daily   Continue propranolol 80 mg daily  2. Graves' Disease:   - No extra-thyroidal manifestations of graves' disease.  Patient  encouraged to have an eye exam -We did discuss that thyroid Graves' disease and  Graves' orbitopathy are 2 separate entity and need to be treated separately.    3. Elevated Alkaline Phosphatase:   - Pt asymptomatic  - Most likely due to methimazole, which causes a cholestatic picture - Will continue to monitor , repeat levels today show improvement in alkaline phosphatase levels.    F/u in 4 months    Signed electronically by: Lyndle Herrlich, MD  [facility] Endocrinology  [facility] [address] [place], [place] [address] Phone: [phone] FAX: [phone]      CC: Anne Ng, NP [address] [place] [address] Phone: [phone]  Fax: [phone]   Return to Endocrinology clinic as below: Future Appointments  Date Time Provider Department Center  07/17/2019  9:30 AM , Konrad Dolores, MD [facility] None

## 2019-07-17 NOTE — Patient Instructions (Signed)
-   Continue Methimazole 5 mg, 3 tablets daily until you hear otherwise from [place].

## 2019-11-13 NOTE — Progress Notes (Signed)
Name: Roberto Mason  MRN/ DOB: [id number], 07/06/1980    Age/ Sex: 39 y.o., male     PCP: Nche, Roberto Brooke, NP   Reason for Endocrinology Evaluation: Hyperthyroidism     Initial Endocrinology Clinic Visit: 10/23/18    PATIENT IDENTIFIER: Roberto Mason is a 39 y.o., male with a past medical history of GERD. He has followed with [place] [facility] since 10/23/18 for consultative assistance with management of his Hyperthyroidism  HISTORICAL SUMMARY: The patient presented to his PCP in May, 2020 with c/o sob that started a month prior to his presentation associated with palpitations. He was found to have a suppressed TSH  At <0.01 uIU/mL . Repeat TFT's confirmed hyperthyroidism diagnosis with suppressed TSH and elevated FT4 at 3.3 ng/dL.    He was started on Methimazole 15 mg daily in May, 2020  Father and paternal grandmother with hyperthyroidism  SUBJECTIVE:    Today (11/14/2019):  Roberto Mason is here for a 3 month follow up on his hyperthyroidism.   He has been noted with weight loss   Denies any vomiting or diarrhea.  Has occasional local neck swelling but no pain.    Methimazole 5 mg 3 tabs    ROS:  As per HPI.   HISTORY:  Past Medical History:  Past Medical History:  Diagnosis Date  . Allergy   . Cold 01/25/2016   pt states x 2 weeks with lingering cough about 10x day-  no fever, nonproductive  . GERD (gastroesophageal reflux disease)   . PONV (postoperative nausea and vomiting)   . 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 Tobacco abuse    Past Surgical History:  Past Surgical History:  Procedure Laterality Date  . FISTULA PLUG N/A 01/27/2016   Procedure: REPAIR OF PERIRECTAL FISTULA intersphincteric fistula;  Surgeon: Michael Boston, MD;  [facility];  Service: General;  Laterality: N/A;  . IRRIGATION AND DEBRIDEMENT ABSCESS N/A 09/22/2015   Procedure: anoscope IRRIGATION AND DEBRIDEMENT ABSCESS;  Surgeon: Fanny Skates, MD;  [facility];  Service: General;  Laterality: N/A;  . KNEE ARTHROSCOPY Right   . MOUTH SURGERY    . RECTAL EXAM UNDER ANESTHESIA N/A 01/27/2016   Procedure: RECTAL EXAM UNDER ANESTHESIA;  Surgeon: Michael Boston, MD;  [facility];  Service: General;  Laterality: N/A;    Social History:  reports that he has been smoking cigarettes. He has a 20.00 pack-year smoking history. He has quit using smokeless tobacco.  His smokeless tobacco use included chew. He reports that he does not drink alcohol and does not use drugs. Family History:  Family History  Problem Relation Age of Onset  . Glaucoma Mother   . Heart disease Father   . Hyperlipidemia Father   . Hypertension Father   . Heart failure Father   . Hyperthyroidism Father   . Diabetes Maternal Grandmother   . Glaucoma Maternal Grandmother   . Diabetes Maternal Grandfather   . Diabetes Paternal Grandmother   . Hyperthyroidism Paternal Grandmother   . Diabetes Paternal Grandfather   . COPD Paternal Grandfather   . Lung cancer Paternal Grandfather      HOME MEDICATIONS: Allergies as of 11/14/2019      Reactions   Oxycodone Nausea Only      Medication List       Accurate as of November 14, 2019  9:39 AM. If you have any questions, ask your nurse or doctor.  cholecalciferol 25 MCG (1000 UNIT) tablet Commonly known as: VITAMIN D3 Take 1 tablet (1,000 Units total) by mouth daily.   esomeprazole 20 MG capsule Commonly known as: NexIUM Take 1 capsule (20 mg total) by mouth daily at 12 noon.   methimazole 5 MG tablet Commonly known as: TAPAZOLE Take 3 tablets (15 mg total) by mouth daily. 3 tabs daily   propranolol 80 MG tablet Commonly known as: INDERAL Take 0.5 tablets (40 mg total) by mouth at bedtime. What changed:   how much to take  when to take this   vitamin B-12 100 MCG tablet Commonly known as: CYANOCOBALAMIN Take 1 tablet (100 mcg total) by mouth daily.         OBJECTIVE:    PHYSICAL EXAM: VS: BP 124/68 (BP Location: Left Arm, Patient Position: Sitting, Cuff Size: Large)   Pulse 79   Ht 6' (1.829 m)   Wt 211 lb 9.6 oz (96 kg)   SpO2 98%   BMI 28.70 kg/m    EXAM: General: Pt appears well and is in NAD  Eyes: External eye exam normal without stare, lid lag or exophthalmos.  EOM intact.   Neck: General: Supple without adenopathy. Thyroid: Thyroid is prominent.  No nodules appreciated. No thyroid bruit.  Lungs: Clear with good BS bilat with no rales, rhonchi, or wheezes  Heart: Auscultation: RRR.  Abdomen: Normoactive bowel sounds, soft, nontender, without masses or organomegaly palpable  Extremities:  BL LE: No pretibial edema normal ROM and strength.  Mental Status: Judgment, insight: Intact Orientation: Oriented to time, place, and person Mood and affect: No depression, anxiety, or agitation     DATA REVIEWED: Results for TOUA, STITES (MRN [id number]) as of 11/14/2019 15:02  Ref. Range 11/14/2019 09:52  Sodium Latest Ref Range: 135 - 145 mEq/L 140  Potassium Latest Ref Range: 3.5 - 5.1 mEq/L 4.0  Chloride Latest Ref Range: 96 - 112 mEq/L 105  CO2 Latest Ref Range: 19 - 32 mEq/L 26  Glucose Latest Ref Range: 70 - 99 mg/dL 84  BUN Latest Ref Range: 6 - 23 mg/dL 11  Creatinine Latest Ref Range: 0.40 - 1.50 mg/dL 4.09  Calcium Latest Ref Range: 8.4 - 10.5 mg/dL 9.6  Alkaline Phosphatase Latest Ref Range: 39 - 117 U/L 133 (H)  Albumin Latest Ref Range: 3.5 - 5.2 g/dL 4.5  AST Latest Ref Range: 0 - 37 U/L 15  ALT Latest Ref Range: 0 - 53 U/L 19  Total Protein Latest Ref Range: 6.0 - 8.3 g/dL 6.7  Total Bilirubin Latest Ref Range: 0.2 - 1.2 mg/dL 0.7  GFR Latest Ref Range: >60.00 mL/min 86.15  TSH Latest Ref Range: 0.35 - 4.50 uIU/mL <0.01 (L)  T4,Free(Direct) Latest Ref Range: 0.60 - 1.60 ng/dL 8.11 (H)    ASSESSMENT / PLAN / RECOMMENDATIONS:   1. Hyperthyroidism Secondary to Graves' Disease:   - He is clinically and biochemically  hyperthyroid - No local neck symptoms  - He has been on the same dose for the past year, we did discuss other options such as RAI ablation , but he is not ready for this yet. He understand with RAI ablation, LT-4 replacement will be required for life. - We also discussed that smoking reduces the chances of remission    Medications   Increase Methimazole 5 mg, 2 tabs BID   2. Graves' Disease:   - No extra-thyroidal manifestations of graves' disease.  Patient encouraged to have an eye exam  3. Elevated Alkaline Phosphatase:   -  Pt asymptomatic  - Most likely due to methimazole, which causes a cholestatic picture - Levels are improving, will continue to monitor    F/u in 4 months    Signed electronically by: Lyndle Herrlich, MD  [facility] Endocrinology  [facility] [address] [place] [place], [place] [address] Phone: [phone] FAX: [phone]      CC: Anne Ng, NP [address] [place] [address] Phone: [phone]  Fax: [phone]   Return to Endocrinology clinic as below: No future appointments.

## 2019-11-14 ENCOUNTER — Other Ambulatory Visit: Payer: Self-pay

## 2019-11-14 ENCOUNTER — Encounter: Payer: Self-pay | Admitting: Internal Medicine

## 2019-11-14 ENCOUNTER — Ambulatory Visit: Payer: PRIVATE HEALTH INSURANCE | Admitting: Internal Medicine

## 2019-11-14 VITALS — BP 124/68 | HR 79 | Ht 72.0 in | Wt 211.6 lb

## 2019-11-14 DIAGNOSIS — E049 Nontoxic goiter, unspecified: Secondary | ICD-10-CM

## 2019-11-14 DIAGNOSIS — E0500 Thyrotoxicosis with diffuse goiter without thyrotoxic crisis or storm: Secondary | ICD-10-CM | POA: Diagnosis not present

## 2019-11-14 DIAGNOSIS — E0590 Thyrotoxicosis, unspecified without thyrotoxic crisis or storm: Secondary | ICD-10-CM | POA: Diagnosis not present

## 2019-11-14 DIAGNOSIS — R748 Abnormal levels of other serum enzymes: Secondary | ICD-10-CM | POA: Diagnosis not present

## 2019-11-14 LAB — TSH: TSH: <0.01 u[IU]/mL — ABNORMAL LOW (ref 0.35–4.50)

## 2019-11-14 LAB — COMPREHENSIVE METABOLIC PANEL
ALT: 19 U/L (ref 0–53)
AST: 15 U/L (ref 0–37)
Albumin: 4.5 g/dL (ref 3.5–5.2)
Alkaline Phosphatase: 133 U/L — ABNORMAL HIGH (ref 39–117)
BUN: 11 mg/dL (ref 6–23)
CO2: 26 mEq/L (ref 19–32)
Calcium: 9.6 mg/dL (ref 8.4–10.5)
Chloride: 105 mEq/L (ref 96–112)
Creatinine, Ser: 0.96 mg/dL (ref 0.40–1.50)
GFR: 86.15 mL/min (ref >60.00)
Glucose, Bld: 84 mg/dL (ref 70–99)
Potassium: 4 mEq/L (ref 3.5–5.1)
Sodium: 140 mEq/L (ref 135–145)
Total Bilirubin: 0.7 mg/dL (ref 0.2–1.2)
Total Protein: 6.7 g/dL (ref 6.0–8.3)

## 2019-11-14 LAB — T4, FREE: Free T4: 1.95 ng/dL — ABNORMAL HIGH (ref 0.60–1.60)

## 2019-11-14 MED ORDER — METHIMAZOLE 5 MG PO TABS: 10.0000 mg | ORAL_TABLET | Freq: Two times a day (BID) | ORAL | 6 refills | Status: DC

## 2019-11-14 MED ORDER — METHIMAZOLE 5 MG PO TABS: 15.0000 mg | ORAL_TABLET | Freq: Every day | ORAL | 0 refills | Status: DC

## 2019-11-14 NOTE — Patient Instructions (Signed)
-   Continue Methimazole 5 mg, 3 tablets daily until you hear otherwise from [place].

## 2019-12-03 ENCOUNTER — Other Ambulatory Visit: Payer: Self-pay

## 2019-12-03 ENCOUNTER — Telehealth: Payer: Self-pay | Admitting: Internal Medicine

## 2019-12-03 DIAGNOSIS — E049 Nontoxic goiter, unspecified: Secondary | ICD-10-CM

## 2019-12-03 DIAGNOSIS — E0590 Thyrotoxicosis, unspecified without thyrotoxic crisis or storm: Secondary | ICD-10-CM

## 2019-12-03 MED ORDER — METHIMAZOLE 5 MG PO TABS: ORAL_TABLET | ORAL | 6 refills | Status: DC

## 2019-12-03 MED ORDER — METHIMAZOLE 5 MG PO TABS: 10.0000 mg | ORAL_TABLET | Freq: Two times a day (BID) | ORAL | 6 refills | Status: DC

## 2019-12-03 NOTE — Telephone Encounter (Signed)
Spoke to pharmacist and pt and placed order for correct dose according to documentation in last lab notes.

## 2019-12-03 NOTE — Telephone Encounter (Signed)
Patient called states pharmacy has 2 different prescriptions for two different dosage amounts and needed to clarify which one to fill and he stated they had faxed [place] a few times regarding this. Ph# [phone]

## 2020-02-04 ENCOUNTER — Encounter: Payer: Self-pay | Admitting: Internal Medicine

## 2020-02-05 ENCOUNTER — Encounter: Payer: Self-pay | Admitting: Internal Medicine

## 2020-02-11 ENCOUNTER — Other Ambulatory Visit: Payer: Self-pay | Admitting: Orthopedic Surgery

## 2020-02-11 DIAGNOSIS — M25562 Pain in left knee: Secondary | ICD-10-CM

## 2020-03-07 ENCOUNTER — Ambulatory Visit
Admission: RE | Admit: 2020-03-07 | Discharge: 2020-03-07 | Disposition: A | Discharge status: Home / Self Care | Payer: PRIVATE HEALTH INSURANCE | Source: Ambulatory Visit | Attending: Orthopedic Surgery | Admitting: Orthopedic Surgery

## 2020-03-07 ENCOUNTER — Other Ambulatory Visit: Payer: Self-pay

## 2020-03-07 DIAGNOSIS — M25562 Pain in left knee: Secondary | ICD-10-CM

## 2020-03-19 ENCOUNTER — Encounter: Payer: Self-pay | Admitting: Internal Medicine

## 2020-03-19 ENCOUNTER — Other Ambulatory Visit: Payer: Self-pay

## 2020-03-19 ENCOUNTER — Ambulatory Visit: Payer: PRIVATE HEALTH INSURANCE | Admitting: Internal Medicine

## 2020-03-19 VITALS — BP 136/82 | HR 84 | Ht 72.0 in | Wt 221.5 lb

## 2020-03-19 DIAGNOSIS — E049 Nontoxic goiter, unspecified: Secondary | ICD-10-CM

## 2020-03-19 DIAGNOSIS — E0500 Thyrotoxicosis with diffuse goiter without thyrotoxic crisis or storm: Secondary | ICD-10-CM | POA: Diagnosis not present

## 2020-03-19 DIAGNOSIS — R748 Abnormal levels of other serum enzymes: Secondary | ICD-10-CM

## 2020-03-19 DIAGNOSIS — E0590 Thyrotoxicosis, unspecified without thyrotoxic crisis or storm: Secondary | ICD-10-CM | POA: Diagnosis not present

## 2020-03-19 LAB — CBC WITH DIFFERENTIAL/PLATELET
Basophils Absolute: 0.1 10*3/uL (ref 0.0–0.1)
Basophils Relative: 1.1 % (ref 0.0–3.0)
Eosinophils Absolute: 0.2 10*3/uL (ref 0.0–0.7)
Eosinophils Relative: 3 % (ref 0.0–5.0)
HCT: 43.6 % (ref 39.0–52.0)
Hemoglobin: 14.7 g/dL (ref 13.0–17.0)
Lymphocytes Relative: 26.5 % (ref 12.0–46.0)
Lymphs Abs: 2.2 10*3/uL (ref 0.7–4.0)
MCHC: 33.7 g/dL (ref 30.0–36.0)
MCV: 85.4 fl (ref 78.0–100.0)
Monocytes Absolute: 0.4 10*3/uL (ref 0.1–1.0)
Monocytes Relative: 5.3 % (ref 3.0–12.0)
Neutro Abs: 5.2 10*3/uL (ref 1.4–7.7)
Neutrophils Relative %: 64.1 % (ref 43.0–77.0)
Platelets: 260 10*3/uL (ref 150.0–400.0)
RBC: 5.1 Mil/uL (ref 4.22–5.81)
RDW: 15.2 % (ref 11.5–15.5)
WBC: 8.2 10*3/uL (ref 4.0–10.5)

## 2020-03-19 LAB — COMPREHENSIVE METABOLIC PANEL
ALT: 18 U/L (ref 0–53)
AST: 16 U/L (ref 0–37)
Albumin: 4.6 g/dL (ref 3.5–5.2)
Alkaline Phosphatase: 100 U/L (ref 39–117)
BUN: 8 mg/dL (ref 6–23)
CO2: 30 mEq/L (ref 19–32)
Calcium: 9.2 mg/dL (ref 8.4–10.5)
Chloride: 104 mEq/L (ref 96–112)
Creatinine, Ser: 1.1 mg/dL (ref 0.40–1.50)
GFR: 83.25 mL/min (ref >60.00)
Glucose, Bld: 82 mg/dL (ref 70–99)
Potassium: 4.1 mEq/L (ref 3.5–5.1)
Sodium: 140 mEq/L (ref 135–145)
Total Bilirubin: 0.9 mg/dL (ref 0.2–1.2)
Total Protein: 7 g/dL (ref 6.0–8.3)

## 2020-03-19 LAB — T4, FREE: Free T4: 0.3 ng/dL — ABNORMAL LOW (ref 0.60–1.60)

## 2020-03-19 LAB — TSH: TSH: 2.13 u[IU]/mL (ref 0.35–4.50)

## 2020-03-19 NOTE — Patient Instructions (Signed)
-   Labs today and in 8 weeks

## 2020-03-19 NOTE — Progress Notes (Signed)
Name: Roberto Mason  MRN/ DOB: [id number], 1980-10-06    Age/ Sex: 39 y.o., male     PCP: Nche, Bonna Gains, NP   Reason for Endocrinology Evaluation: Hyperthyroidism     Initial Endocrinology Clinic Visit: 10/23/18    PATIENT IDENTIFIER: Roberto Mason is a 39 y.o., male with a past medical history of GERD. He has followed with [facility] [facility] since 10/23/18 for consultative assistance with management of his Hyperthyroidism  HISTORICAL SUMMARY: The patient presented to his PCP in May, 2020 with c/o sob that started a month prior to his presentation associated with palpitations. He was found to have a suppressed TSH  At <0.01 uIU/mL . Repeat TFT's confirmed hyperthyroidism diagnosis with suppressed TSH and elevated FT4 at 3.3 ng/dL.    He was started on Methimazole 15 mg daily in May, 2020  Father and paternal grandmother with hyperthyroidism  SUBJECTIVE:    Today (03/19/2020):  Roberto Mason is here for a 3 month follow up on his hyperthyroidism.   He has been   Denies any vomiting or diarrhea.  Has occasional local neck swelling but no pain.    Methimazole 5 mg 2 tabs BID     HISTORY:  Past Medical History:  Past Medical History:  Diagnosis Date  . Allergy   . Cold 01/25/2016   pt states x 2 weeks with lingering cough about 10x day-  no fever, nonproductive  . GERD (gastroesophageal reflux disease)   . PONV (postoperative nausea and vomiting)   . 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 Tobacco abuse    Past Surgical History:  Past Surgical History:  Procedure Laterality Date  . FISTULA PLUG N/A 01/27/2016   Procedure: REPAIR OF PERIRECTAL FISTULA intersphincteric fistula;  Surgeon: Karie Soda, MD;  [facility];  Service: General;  Laterality: N/A;  . IRRIGATION AND DEBRIDEMENT ABSCESS N/A 09/22/2015   Procedure: anoscope IRRIGATION AND DEBRIDEMENT ABSCESS;  Surgeon: Claud Kelp, MD;  [facility];  Service:  General;  Laterality: N/A;  . KNEE ARTHROSCOPY Right   . MOUTH SURGERY    . RECTAL EXAM UNDER ANESTHESIA N/A 01/27/2016   Procedure: RECTAL EXAM UNDER ANESTHESIA;  Surgeon: Karie Soda, MD;  [facility];  Service: General;  Laterality: N/A;    Social History:  reports that he has been smoking cigarettes. He has a 20.00 pack-year smoking history. He has quit using smokeless tobacco.  His smokeless tobacco use included chew. He reports that he does not drink alcohol and does not use drugs. Family History:  Family History  Problem Relation Age of Onset  . Glaucoma Mother   . Heart disease Father   . Hyperlipidemia Father   . Hypertension Father   . Heart failure Father   . Hyperthyroidism Father   . Diabetes Maternal Grandmother   . Glaucoma Maternal Grandmother   . Diabetes Maternal Grandfather   . Diabetes Paternal Grandmother   . Hyperthyroidism Paternal Grandmother   . Diabetes Paternal Grandfather   . COPD Paternal Grandfather   . Lung cancer Paternal Grandfather      HOME MEDICATIONS: Allergies as of 03/19/2020      Reactions   Oxycodone Nausea Only      Medication List       Accurate as of March 19, 2020  7:21 AM. If you have any questions, ask your nurse or doctor.        methimazole 5 MG tablet Commonly  known as: TAPAZOLE Take 2 tabs in the morning  and 2 in the evening         OBJECTIVE:   PHYSICAL EXAM: VS: BP 136/82   Pulse 84   Ht 6' (1.829 m)   Wt 221 lb 8 oz (100.5 kg)   SpO2 96%   BMI 30.04 kg/m    EXAM: General: Pt appears well and is in NAD  Eyes: External eye exam normal without stare, lid lag or exophthalmos.  EOM intact.   Neck: General: Supple without adenopathy. Thyroid: Thyroid is prominent.  No nodules appreciated. No thyroid bruit.  Lungs: Clear with good BS bilat with no rales, rhonchi, or wheezes  Heart: Auscultation: RRR.  Abdomen: Normoactive bowel sounds, soft, nontender, without masses or organomegaly palpable   Extremities:  BL LE: No pretibial edema normal ROM and strength.  Mental Status: Judgment, insight: Intact Orientation: Oriented to time, place, and person Mood and affect: No depression, anxiety, or agitation     DATA REVIEWED:  Results for SEWARD, CORAN (MRN [id number]) as of 03/22/2020 07:31  Ref. Range 03/19/2020 11:01  Sodium Latest Ref Range: 135 - 145 mEq/L 140  Potassium Latest Ref Range: 3.5 - 5.1 mEq/L 4.1  Chloride Latest Ref Range: 96 - 112 mEq/L 104  CO2 Latest Ref Range: 19 - 32 mEq/L 30  Glucose Latest Ref Range: 70 - 99 mg/dL 82  BUN Latest Ref Range: 6 - 23 mg/dL 8  Creatinine Latest Ref Range: 0.40 - 1.50 mg/dL 3.82  Calcium Latest Ref Range: 8.4 - 10.5 mg/dL 9.2  Alkaline Phosphatase Latest Ref Range: 39 - 117 U/L 100  Albumin Latest Ref Range: 3.5 - 5.2 g/dL 4.6  AST Latest Ref Range: 0 - 37 U/L 16  ALT Latest Ref Range: 0 - 53 U/L 18  Total Protein Latest Ref Range: 6.0 - 8.3 g/dL 7.0  Total Bilirubin Latest Ref Range: 0.2 - 1.2 mg/dL 0.9  GFR Latest Ref Range: >60.00 mL/min 83.25  WBC Latest Ref Range: 4.0 - 10.5 K/uL 8.2  RBC Latest Ref Range: 4.22 - 5.81 Mil/uL 5.10  Hemoglobin Latest Ref Range: 13.0 - 17.0 g/dL 50.5  HCT Latest Ref Range: 39 - 52 % 43.6  MCV Latest Ref Range: 78.0 - 100.0 fl 85.4  MCHC Latest Ref Range: 30.0 - 36.0 g/dL 39.7  RDW Latest Ref Range: 11.5 - 15.5 % 15.2  Platelets Latest Ref Range: 150 - 400 K/uL 260.0  Neutrophils Latest Ref Range: 43 - 77 % 64.1  Lymphocytes Latest Ref Range: 12 - 46 % 26.5  Monocytes Relative Latest Ref Range: 3 - 12 % 5.3  Eosinophil Latest Ref Range: 0 - 5 % 3.0  Basophil Latest Ref Range: 0 - 3 % 1.1  NEUT# Latest Ref Range: 1.4 - 7.7 K/uL 5.2  Lymphocyte # Latest Ref Range: 0.7 - 4.0 K/uL 2.2  Monocyte # Latest Ref Range: 0.1 - 1.0 K/uL 0.4  Eosinophils Absolute Latest Ref Range: 0.0 - 0.7 K/uL 0.2  Basophils Absolute Latest Ref Range: 0.0 - 0.1 K/uL 0.1  TSH Latest Ref Range: 0.35 - 4.50  uIU/mL 2.13  T4,Free(Direct) Latest Ref Range: 0.60 - 1.60 ng/dL 6.73 (L)    ASSESSMENT / PLAN / RECOMMENDATIONS:   1. Hyperthyroidism Secondary to Graves' Disease:   - He is clinically and biochemically hypothyroid - No local neck symptoms  - Has been requiring higher doses of methimazole despite being on thionamide therapy for ~ 1.5 yrs.  We discussed other  options such as RAI ablation , but he is not ready for this . He understand with RAI ablation, LT-4 replacement will be required for life.    Medications   Decrease  Methimazole 5 mg, to 3 tablets daily    2. Graves' Disease:   - No extra-thyroidal manifestations of graves' disease.    3. Elevated Alkaline Phosphatase:   - Resolved     F/u in 4 months  Labs in 8 weeks   Signed electronically by: Lyndle Herrlich, MD  [facility] Endocrinology  [facility] [address] [place] [place], [place] [address] Phone: [phone] FAX: [phone]      CC: Anne Ng, NP [address] [place] [address] Phone: [phone]  Fax: [phone]   Return to Endocrinology clinic as below: Future Appointments  Date Time Provider Department Center  03/19/2020 10:10 AM Arin Peral, Konrad Dolores, MD [facility] None

## 2020-03-22 MED ORDER — METHIMAZOLE 5 MG PO TABS: 15.0000 mg | ORAL_TABLET | Freq: Every day | ORAL | 6 refills | Status: DC

## 2020-03-26 ENCOUNTER — Telehealth: Payer: Self-pay | Admitting: Internal Medicine

## 2020-03-26 DIAGNOSIS — E0590 Thyrotoxicosis, unspecified without thyrotoxic crisis or storm: Secondary | ICD-10-CM

## 2020-03-26 DIAGNOSIS — E049 Nontoxic goiter, unspecified: Secondary | ICD-10-CM

## 2020-03-26 MED ORDER — METHIMAZOLE 5 MG PO TABS: ORAL_TABLET | ORAL | 6 refills | Status: DC

## 2020-03-26 NOTE — Telephone Encounter (Signed)
Rx was sent with 2 different. Resent Rx with the correct direction.

## 2020-05-18 ENCOUNTER — Other Ambulatory Visit: Payer: PRIVATE HEALTH INSURANCE

## 2020-05-26 ENCOUNTER — Other Ambulatory Visit: Payer: PRIVATE HEALTH INSURANCE

## 2020-06-01 ENCOUNTER — Other Ambulatory Visit: Payer: Self-pay

## 2020-06-01 ENCOUNTER — Other Ambulatory Visit (INDEPENDENT_AMBULATORY_CARE_PROVIDER_SITE_OTHER): Payer: PRIVATE HEALTH INSURANCE

## 2020-06-01 DIAGNOSIS — E0590 Thyrotoxicosis, unspecified without thyrotoxic crisis or storm: Secondary | ICD-10-CM

## 2020-06-01 LAB — T4, FREE: Free T4: 1.56 ng/dL (ref 0.60–1.60)

## 2020-06-01 LAB — TSH: TSH: <0.01 u[IU]/mL — ABNORMAL LOW (ref 0.35–4.50)

## 2020-06-09 ENCOUNTER — Encounter: Payer: Self-pay | Admitting: Internal Medicine

## 2020-07-21 ENCOUNTER — Ambulatory Visit: Payer: PRIVATE HEALTH INSURANCE | Admitting: Internal Medicine

## 2020-07-21 ENCOUNTER — Other Ambulatory Visit: Payer: Self-pay

## 2020-07-21 ENCOUNTER — Encounter: Payer: Self-pay | Admitting: Internal Medicine

## 2020-07-21 VITALS — BP 142/82 | HR 74 | Ht 72.0 in | Wt 225.2 lb

## 2020-07-21 DIAGNOSIS — E0590 Thyrotoxicosis, unspecified without thyrotoxic crisis or storm: Secondary | ICD-10-CM | POA: Diagnosis not present

## 2020-07-21 DIAGNOSIS — E0500 Thyrotoxicosis with diffuse goiter without thyrotoxic crisis or storm: Secondary | ICD-10-CM

## 2020-07-21 NOTE — Progress Notes (Signed)
Name: Roberto Mason  MRN/ DOB: [id number], 12/12/1980    Age/ Sex: 40 y.o., male     PCP: Nche, Bonna Gains, NP   Reason for Endocrinology Evaluation: Hyperthyroidism     Initial Endocrinology Clinic Visit: 10/23/2018    PATIENT IDENTIFIER: [name] is a 40 y.o., male with a past medical history of GERD. He has followed with [facility] since 10/23/18 for consultative assistance with management of his Hyperthyroidism  HISTORICAL SUMMARY: The patient presented to his PCP in May, 2020 with c/o sob that started a month prior to his presentation associated with palpitations. He was found to have a suppressed TSH  At <0.01 uIU/mL . Repeat TFT's confirmed hyperthyroidism diagnosis with suppressed TSH and elevated FT4 at 3.3 ng/dL.    He was started on Methimazole 15 mg daily in May, 2020  Father and paternal grandmother with hyperthyroidism  SUBJECTIVE:    Today (07/21/2020):  [name] is here for a follow up on his hyperthyroidism.   Weight has been gradually increasing  Has been constipation  Denies any vomiting or diarrhea.  Has been having back  pain Denies local neck symptoms    Methimazole 5 mg TID     HISTORY:  Past Medical History:  Past Medical History:  Diagnosis Date  . Allergy   . Cold 01/25/2016   pt states x 2 weeks with lingering cough about 10x day-  no fever, nonproductive  . GERD (gastroesophageal reflux disease)   . PONV (postoperative nausea and vomiting)   . 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 Tobacco abuse    Past Surgical History:  Past Surgical History:  Procedure Laterality Date  . FISTULA PLUG N/A 01/27/2016   Procedure: REPAIR OF PERIRECTAL FISTULA intersphincteric fistula;  Surgeon: Karie Soda, MD;  Location: [facility];  Service: General;  Laterality: N/A;  . IRRIGATION AND DEBRIDEMENT ABSCESS N/A 09/22/2015   Procedure: anoscope IRRIGATION AND DEBRIDEMENT ABSCESS;  Surgeon: Claud Kelp, MD;  Location: [facility];  Service: General;  Laterality: N/A;  . KNEE ARTHROSCOPY Right   . MOUTH SURGERY    . RECTAL EXAM UNDER ANESTHESIA N/A 01/27/2016   Procedure: RECTAL EXAM UNDER ANESTHESIA;  Surgeon: Karie Soda, MD;  Location: [facility];  Service: General;  Laterality: N/A;    Social History:  reports that he has been smoking cigarettes. He has a 20.00 pack-year smoking history. He has quit using smokeless tobacco.  His smokeless tobacco use included chew. He reports that he does not drink alcohol and does not use drugs. Family History:  Family History  Problem Relation Age of Onset  . Glaucoma Mother   . Heart disease Father   . Hyperlipidemia Father   . Hypertension Father   . Heart failure Father   . Hyperthyroidism Father   . Diabetes Maternal Grandmother   . Glaucoma Maternal Grandmother   . Diabetes Maternal Grandfather   . Diabetes Paternal Grandmother   . Hyperthyroidism Paternal Grandmother   . Diabetes Paternal Grandfather   . COPD Paternal Grandfather   . Lung cancer Paternal Grandfather      HOME MEDICATIONS: Allergies as of 07/21/2020      Reactions   Oxycodone Nausea Only      Medication List       Accurate as of July 21, 2020  7:21 AM. If you have any questions, ask your nurse or doctor.        methimazole 5  MG tablet Commonly known as: TAPAZOLE Take 2 tabs in the morning  and 2 in the evening         OBJECTIVE:   PHYSICAL EXAM: VS: BP (!) 142/82   Pulse 74   Ht 6' (1.829 m)   Wt 225 lb 4 oz (102.2 kg)   SpO2 99%   BMI 30.55 kg/m    EXAM: General: Pt appears well and is in NAD  Eyes: External eye exam normal without stare, lid lag or exophthalmos.  EOM intact.   Neck: General: Supple without adenopathy. Thyroid: Thyroid is prominent.  No nodules appreciated. No thyroid bruit.  Lungs: Clear with good BS bilat with no rales, rhonchi, or wheezes  Heart: Auscultation: RRR.  Abdomen: Normoactive bowel sounds, soft, nontender,  without masses or organomegaly palpable  Extremities:  BL LE: No pretibial edema normal ROM and strength.  Mental Status: Judgment, insight: Intact Orientation: Oriented to time, place, and person Mood and affect: No depression, anxiety, or agitation     DATA REVIEWED: Results for OMER, PUCCINELLI (MRN [id number]) as of 07/22/2020 11:09  Ref. Range 07/21/2020 08:53  TSH Latest Ref Range: 0.35 - 4.50 uIU/mL <0.01 (L)  T4,Free(Direct) Latest Ref Range: 0.60 - 1.60 ng/dL 4.43   Results for MACAULEY, MOSSBERG (MRN [id number]) as of 03/22/2020 07:31  Ref. Range 03/19/2020 11:01  Sodium Latest Ref Range: 135 - 145 mEq/L 140  Potassium Latest Ref Range: 3.5 - 5.1 mEq/L 4.1  Chloride Latest Ref Range: 96 - 112 mEq/L 104  CO2 Latest Ref Range: 19 - 32 mEq/L 30  Glucose Latest Ref Range: 70 - 99 mg/dL 82  BUN Latest Ref Range: 6 - 23 mg/dL 8  Creatinine Latest Ref Range: 0.40 - 1.50 mg/dL 1.95  Calcium Latest Ref Range: 8.4 - 10.5 mg/dL 9.2  Alkaline Phosphatase Latest Ref Range: 39 - 117 U/L 100  Albumin Latest Ref Range: 3.5 - 5.2 g/dL 4.6  AST Latest Ref Range: 0 - 37 U/L 16  ALT Latest Ref Range: 0 - 53 U/L 18  Total Protein Latest Ref Range: 6.0 - 8.3 g/dL 7.0  Total Bilirubin Latest Ref Range: 0.2 - 1.2 mg/dL 0.9  GFR Latest Ref Range: >60.00 mL/min 83.25  WBC Latest Ref Range: 4.0 - 10.5 K/uL 8.2  RBC Latest Ref Range: 4.22 - 5.81 Mil/uL 5.10  Hemoglobin Latest Ref Range: 13.0 - 17.0 g/dL 09.3  HCT Latest Ref Range: 39 - 52 % 43.6  MCV Latest Ref Range: 78.0 - 100.0 fl 85.4  MCHC Latest Ref Range: 30.0 - 36.0 g/dL 26.7  RDW Latest Ref Range: 11.5 - 15.5 % 15.2  Platelets Latest Ref Range: 150 - 400 K/uL 260.0  Neutrophils Latest Ref Range: 43 - 77 % 64.1  Lymphocytes Latest Ref Range: 12 - 46 % 26.5  Monocytes Relative Latest Ref Range: 3 - 12 % 5.3  Eosinophil Latest Ref Range: 0 - 5 % 3.0  Basophil Latest Ref Range: 0 - 3 % 1.1  NEUT# Latest Ref Range: 1.4 - 7.7 K/uL 5.2  Lymphocyte  # Latest Ref Range: 0.7 - 4.0 K/uL 2.2  Monocyte # Latest Ref Range: 0.1 - 1.0 K/uL 0.4  Eosinophils Absolute Latest Ref Range: 0.0 - 0.7 K/uL 0.2  Basophils Absolute Latest Ref Range: 0.0 - 0.1 K/uL 0.1    ASSESSMENT / PLAN / RECOMMENDATIONS:   1. Hyperthyroidism Secondary to Graves' Disease:   - He is clinically euthyroid  - No local neck symptoms  -  Has been requiring higher doses of methimazole despite being on thionamide therapy for ~ 2 yrs.  We discussed other options such as RAI ablation ,which he agreed to this time. He understand with RAI ablation, LT-4 replacement will be required for life. - He will hold Methimazole 5-7 days prior to the Uptake and scan , then resume after the scan, he understands to stop Methimazole 5-7 days prior to RAI ablation    Medications   Continue  Methimazole 5 mg,  3 tablets daily    2. Graves' Disease:   - No extra-thyroidal manifestations of graves' disease.       F/u in 4 months  Labs in 8 weeks     Addendum: Discussed labs 07/22/2020 at 1115 AM     Signed electronically by: Lyndle Herrlich, MD  [facility]  [facility] [address] [place] [place], [place] [address] Phone: [phone] FAX: [phone]      CC: Anne Ng, NP [address] [place] [address] Phone: [phone]  Fax: [phone]   Return to Endocrinology clinic as below: Future Appointments  Date Time Provider Department Center  07/21/2020  8:30 AM Amere Bricco, Konrad Dolores, MD [facility] None

## 2020-07-22 LAB — T4, FREE: Free T4: 1.5 ng/dL (ref 0.60–1.60)

## 2020-07-22 LAB — TSH: TSH: <0.01 u[IU]/mL — ABNORMAL LOW (ref 0.35–4.50)

## 2020-08-05 ENCOUNTER — Encounter (HOSPITAL_COMMUNITY): Payer: PRIVATE HEALTH INSURANCE

## 2020-08-05 ENCOUNTER — Encounter (HOSPITAL_COMMUNITY): Admission: RE | Admit: 2020-08-05 | Payer: PRIVATE HEALTH INSURANCE | Source: Ambulatory Visit

## 2020-08-06 ENCOUNTER — Encounter (HOSPITAL_COMMUNITY): Payer: PRIVATE HEALTH INSURANCE

## 2020-08-12 ENCOUNTER — Ambulatory Visit (HOSPITAL_COMMUNITY)
Admission: RE | Admit: 2020-08-12 | Discharge: 2020-08-12 | Disposition: A | Discharge status: Home / Self Care | Payer: PRIVATE HEALTH INSURANCE | Source: Ambulatory Visit | Attending: Internal Medicine | Admitting: Internal Medicine

## 2020-08-12 ENCOUNTER — Other Ambulatory Visit: Payer: Self-pay

## 2020-08-12 DIAGNOSIS — E0500 Thyrotoxicosis with diffuse goiter without thyrotoxic crisis or storm: Secondary | ICD-10-CM | POA: Diagnosis present

## 2020-08-12 MED ORDER — SODIUM IODIDE I-123 7.4 MBQ CAPS
462.0000 | ORAL_CAPSULE | Freq: Once | ORAL | Status: AC
  Administered 2020-08-12: 462 via ORAL

## 2020-08-13 ENCOUNTER — Ambulatory Visit (HOSPITAL_COMMUNITY)
Admission: RE | Admit: 2020-08-13 | Discharge: 2020-08-13 | Disposition: A | Discharge status: Home / Self Care | Payer: PRIVATE HEALTH INSURANCE | Source: Ambulatory Visit | Attending: Internal Medicine | Admitting: Internal Medicine

## 2020-08-16 ENCOUNTER — Other Ambulatory Visit: Payer: Self-pay | Admitting: Internal Medicine

## 2020-08-16 DIAGNOSIS — E0590 Thyrotoxicosis, unspecified without thyrotoxic crisis or storm: Secondary | ICD-10-CM

## 2020-08-25 ENCOUNTER — Telehealth: Payer: Self-pay | Admitting: Internal Medicine

## 2020-08-25 MED ORDER — ATENOLOL 50 MG PO TABS: 50.0000 mg | ORAL_TABLET | Freq: Every day | ORAL | 3 refills | Status: DC

## 2020-08-25 NOTE — Telephone Encounter (Signed)
Pt called to advise that he had N/V/D fever episode, went to ER [facility]) - is asking if he should take methimazole before the RAI or not.  Please contact patient to advise at [phone]

## 2020-08-25 NOTE — Telephone Encounter (Signed)
Spoke to the pt and he has not restarted methimazole since his thyroid uptake and scan.   His visit to the ED revealed normal EKG per pt but he did have hyperthyroidism bio chemically, there's no point in restarting methimazole since he is scheduled for RAI ablation next week.     Will start Atenolol 50 mg daily , pt instructed to continue this through the treatment  Pt expressed understanding   He is scheduled for labs in 4/27 Lyndle Herrlich, MD  [facility] Endocrinology  [facility] [address] [place], [place] [address] Phone: [phone] FAX: [phone]

## 2020-09-01 ENCOUNTER — Ambulatory Visit (HOSPITAL_COMMUNITY)
Admission: RE | Admit: 2020-09-01 | Discharge: 2020-09-01 | Disposition: A | Discharge status: Home / Self Care | Payer: PRIVATE HEALTH INSURANCE | Source: Ambulatory Visit | Attending: Internal Medicine | Admitting: Internal Medicine

## 2020-09-01 ENCOUNTER — Other Ambulatory Visit: Payer: Self-pay

## 2020-09-01 DIAGNOSIS — E0590 Thyrotoxicosis, unspecified without thyrotoxic crisis or storm: Secondary | ICD-10-CM | POA: Insufficient documentation

## 2020-09-01 MED ORDER — SODIUM IODIDE I 131 CAPSULE
12.7000 | Freq: Once | INTRAVENOUS | Status: AC | PRN
  Administered 2020-09-01: 12.7 via ORAL

## 2020-09-15 ENCOUNTER — Other Ambulatory Visit (INDEPENDENT_AMBULATORY_CARE_PROVIDER_SITE_OTHER): Payer: PRIVATE HEALTH INSURANCE

## 2020-09-15 ENCOUNTER — Other Ambulatory Visit: Payer: Self-pay

## 2020-09-15 ENCOUNTER — Other Ambulatory Visit: Payer: Self-pay | Admitting: Internal Medicine

## 2020-09-15 DIAGNOSIS — E0590 Thyrotoxicosis, unspecified without thyrotoxic crisis or storm: Secondary | ICD-10-CM

## 2020-09-15 LAB — TSH: TSH: <0.01 u[IU]/mL — ABNORMAL LOW (ref 0.35–4.50)

## 2020-09-15 LAB — T4, FREE: Free T4: 4.97 ng/dL — ABNORMAL HIGH (ref 0.60–1.60)

## 2020-09-21 IMAGING — US US THYROID
1 series · 14 of 25 positions shown · non-contrast
Comparison: None.

CLINICAL DATA: Hyperthyroid.

EXAM:
THYROID ULTRASOUND
TECHNIQUE: Ultrasound examination of the thyroid gland and adjacent soft
tissues was performed.

[Series 1: us thyroid · 0.08mm/px · 14 of 38 slices shown]
[im 1/38]
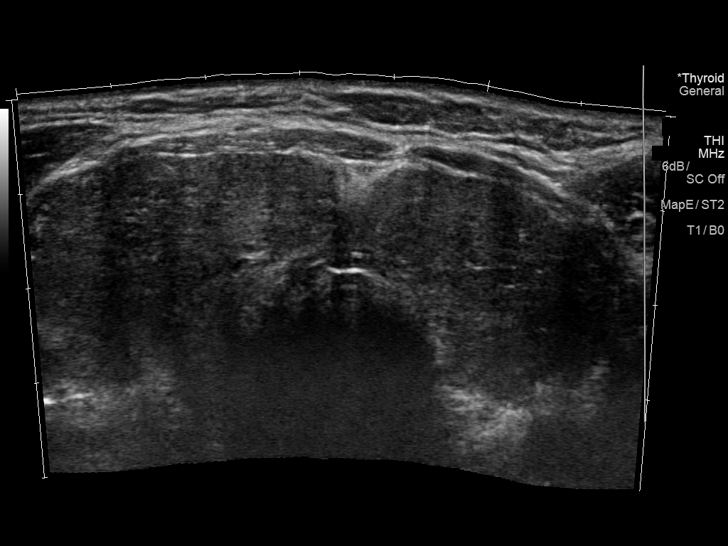
[im 4/38]
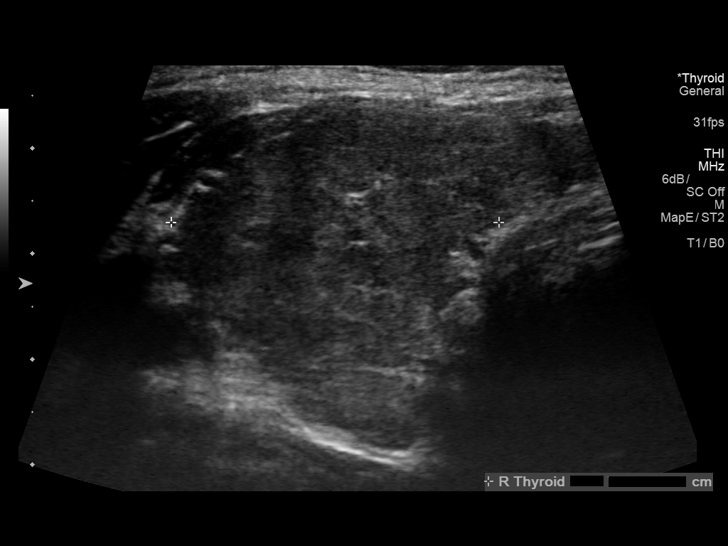
[im 7/38]
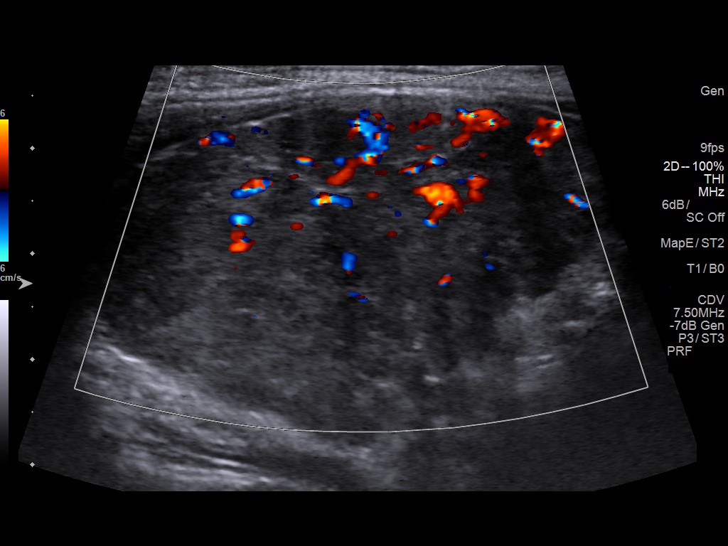
[im 10/38]
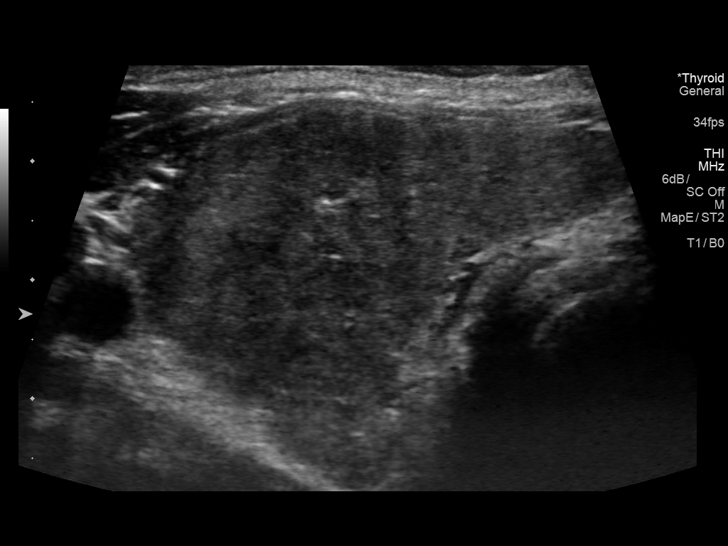
[im 13/38]
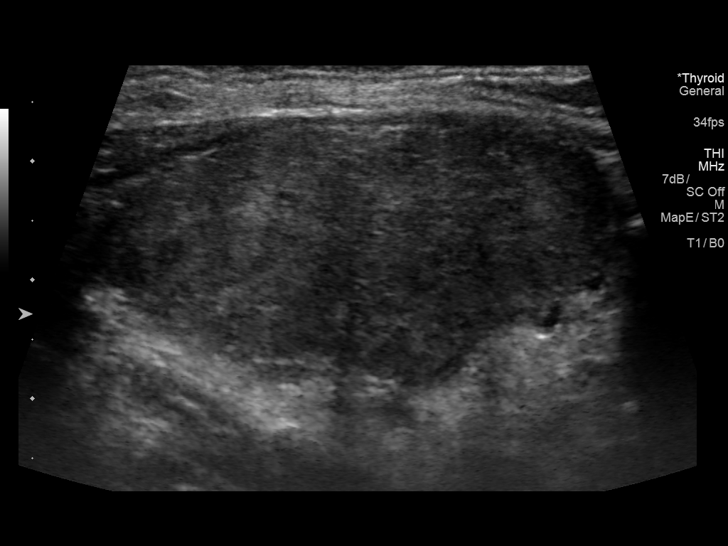
[im 14/38]
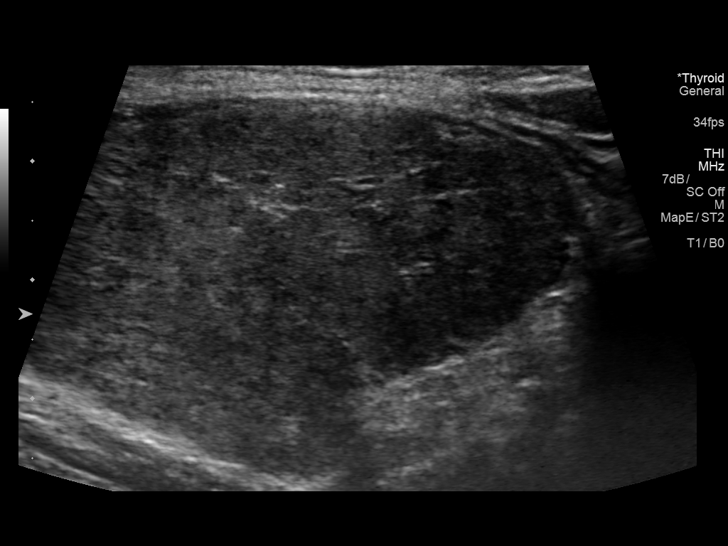
[im 17/38]
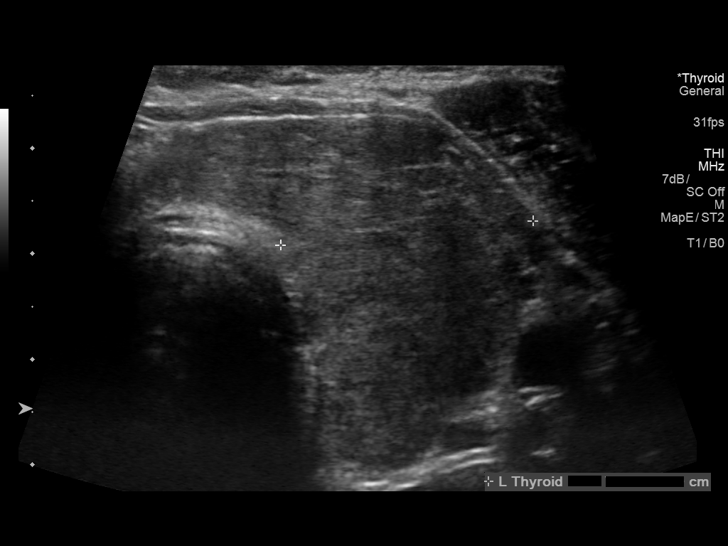
[im 21/38]
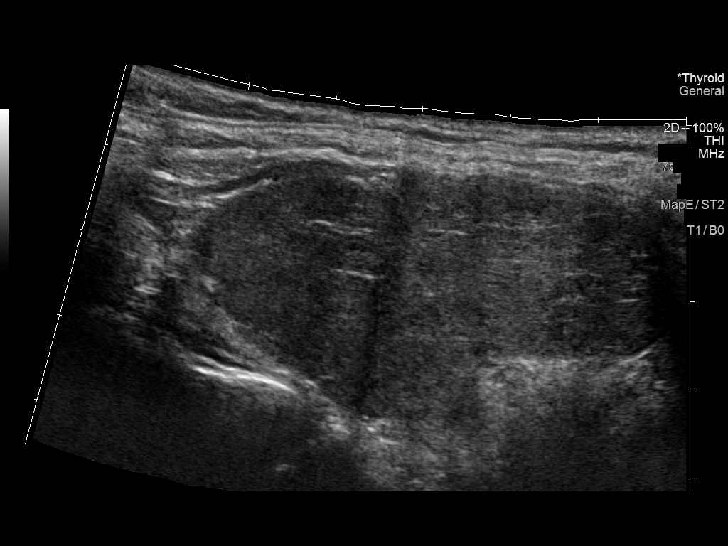
[im 24/38]
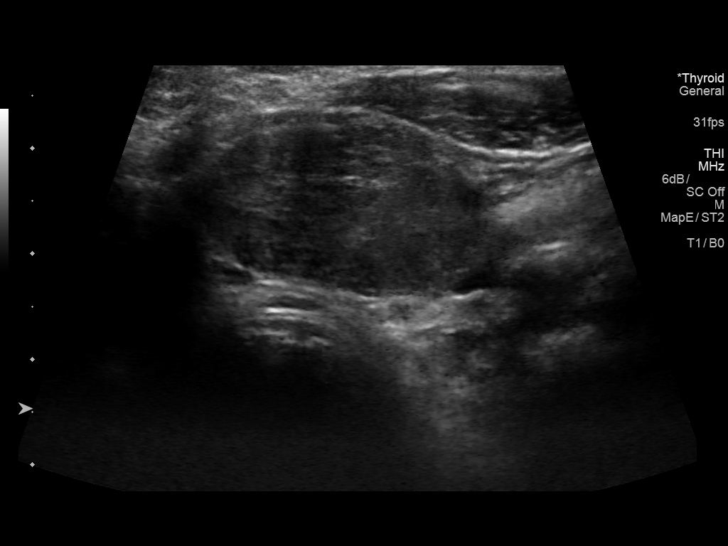
[im 25/38]
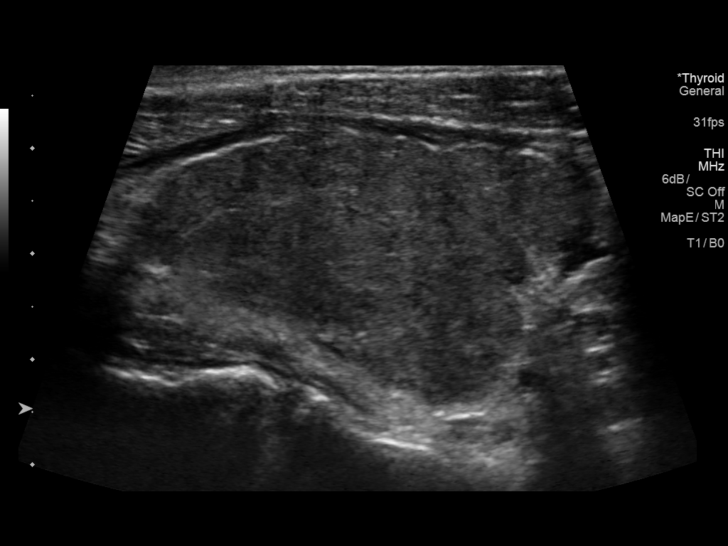
[im 28/38]
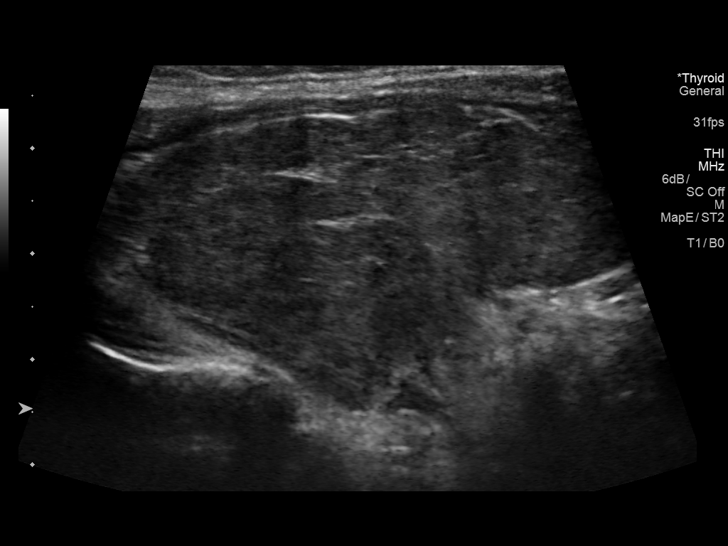
[im 31/38]
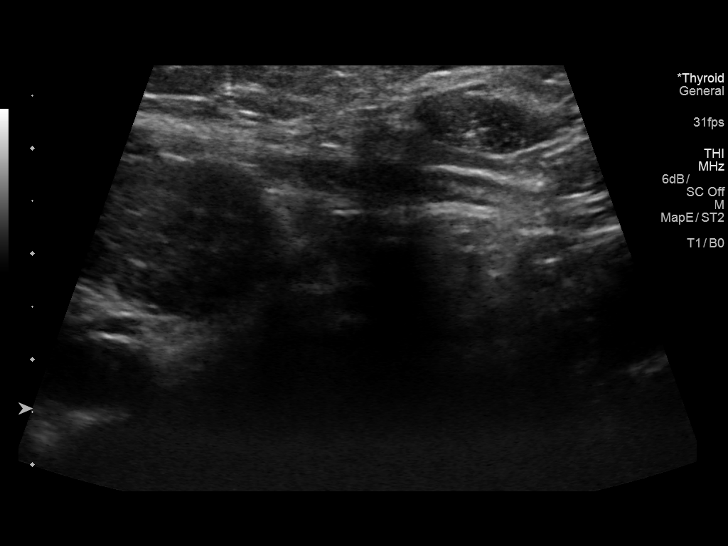
[im 34/38]
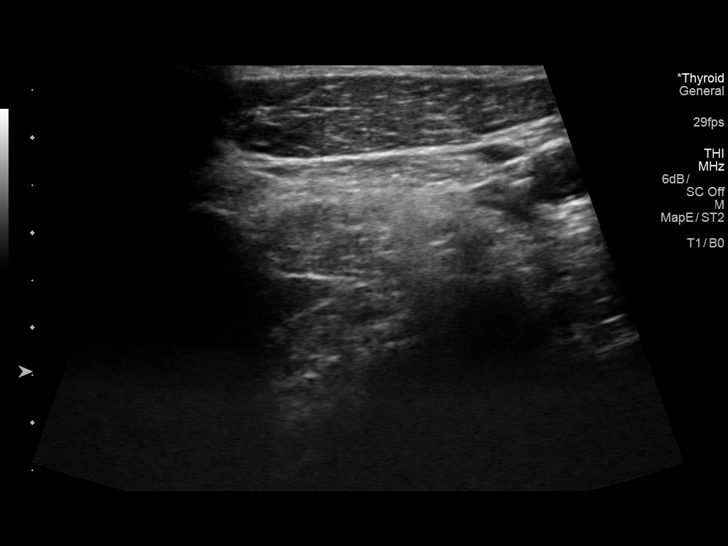
[im 38/38]
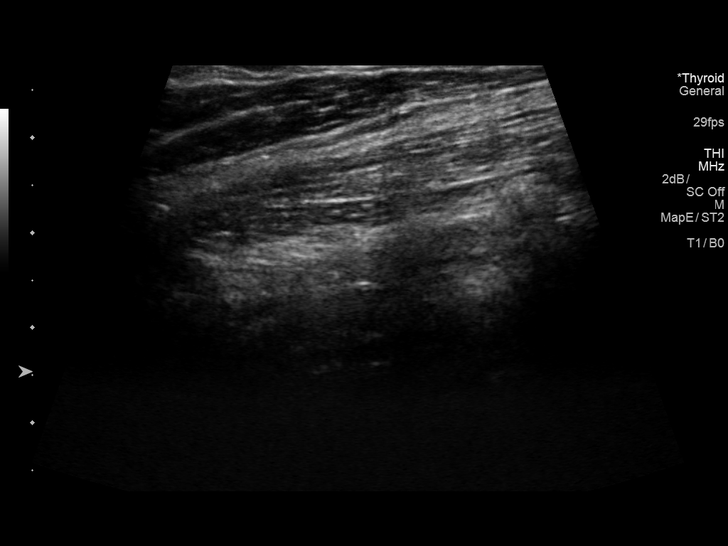

[14 of 25 positions shown; findings below may reference images not displayed]

FINDINGS: Parenchymal Echotexture: Markedly heterogenous - mild diffuse
glandular hyperemia (image 9 and 22).

Isthmus: Enlarged measuring 0.9 cm in diameter

Right lobe: Enlarged measuring 5.0 x 3.2 x 3.1 cm

Left lobe: Enlarged measuring 5.7 x 2.8 x 2.4 cm

_________________________________________________________

Estimated total number of nodules >/= 1 cm: 0

Number of spongiform nodules >/=  2 cm not described below (TR1): 0

Number of mixed cystic and solid nodules >/= 1.5 cm not described
below (TR2): 0

_________________________________________________________

No discrete nodules are seen within the thyroid gland.
IMPRESSION: Enlarged, markedly heterogeneous and potentially hyperemic thyroid
gland without discrete nodule or mass. Findings are nonspecific
though could be seen in the setting of an acute thyroiditis.
Clinical correlation is advised.

## 2020-10-11 ENCOUNTER — Other Ambulatory Visit: Payer: Self-pay

## 2020-10-11 ENCOUNTER — Other Ambulatory Visit (INDEPENDENT_AMBULATORY_CARE_PROVIDER_SITE_OTHER): Payer: PRIVATE HEALTH INSURANCE

## 2020-10-11 DIAGNOSIS — E0590 Thyrotoxicosis, unspecified without thyrotoxic crisis or storm: Secondary | ICD-10-CM | POA: Diagnosis not present

## 2020-10-11 LAB — TSH: TSH: <0.01 u[IU]/mL — ABNORMAL LOW (ref 0.35–4.50)

## 2020-10-11 LAB — T4, FREE: Free T4: 1.53 ng/dL (ref 0.60–1.60)

## 2020-11-25 ENCOUNTER — Encounter: Payer: Self-pay | Admitting: Internal Medicine

## 2020-11-25 ENCOUNTER — Other Ambulatory Visit: Payer: Self-pay

## 2020-11-25 ENCOUNTER — Ambulatory Visit (INDEPENDENT_AMBULATORY_CARE_PROVIDER_SITE_OTHER): Payer: No Typology Code available for payment source | Admitting: Internal Medicine

## 2020-11-25 VITALS — BP 132/88 | HR 76 | Ht 72.0 in | Wt 205.0 lb

## 2020-11-25 DIAGNOSIS — L52 Erythema nodosum: Secondary | ICD-10-CM

## 2020-11-25 DIAGNOSIS — E0590 Thyrotoxicosis, unspecified without thyrotoxic crisis or storm: Secondary | ICD-10-CM

## 2020-11-25 DIAGNOSIS — E0500 Thyrotoxicosis with diffuse goiter without thyrotoxic crisis or storm: Secondary | ICD-10-CM

## 2020-11-25 LAB — T4, FREE: Free T4: 1.45 ng/dL (ref 0.60–1.60)

## 2020-11-25 LAB — TSH: TSH: 0 u[IU]/mL — ABNORMAL LOW (ref 0.35–5.50)

## 2020-11-25 NOTE — Progress Notes (Signed)
Name: Roberto Mason  MRN/ DOB: [id number], 01-Sep-1980    Age/ Sex: 40 y.o., male     PCP: Nche, Bonna Gains, NP   Reason for Endocrinology Evaluation: Hyperthyroidism     Initial Endocrinology Clinic Visit: 10/23/2018    PATIENT IDENTIFIER: [name] is a 40 y.o., male with a past medical history of GERD. He has followed with [facility] since 10/23/18 for consultative assistance with management of his Hyperthyroidism  HISTORICAL SUMMARY: The patient presented to his PCP in May, 2020 with c/o sob that started a month prior to his presentation associated with palpitations. He was found to have a suppressed TSH  At <0.01 uIU/mL . Repeat TFT's confirmed hyperthyroidism diagnosis with suppressed TSH and elevated FT4 at 3.3 ng/dL.      He was started on Methimazole 15 mg daily in May, 2020  Thyroid uptake and scan 07/2020 showed a 24 hour I-123 uptake = 59.4% (normal 10-30%)  S/P RAI ablation on 09/01/2020 with 12.7 mCi I-131 sodium iodide orally    Father and paternal grandmother with hyperthyroidism   SUBJECTIVE:    Today (11/25/2020):  [name] is here for a follow up on his hyperthyroidism.   He is S/P RAI ablation on 09/01/2020 with 12.7 mCi I-131 sodium iodide orally   Over all has been feeling better except for fatigue for the past 2 weeks  Has lost weight  with lifestyle changes  Denies constipation  Denies local neck symptoms  Has occasional watery eyes , no prior eye exams   Has noted round skin lesions on both shins for the past 4 weeks, stable with no pain. 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  88months ago   Tobacco abuse    Past Surgical History:  Past Surgical History:  Procedure Laterality Date   FISTULA PLUG N/A 01/27/2016   Procedure: REPAIR OF PERIRECTAL FISTULA intersphincteric fistula;  Surgeon: Karie Soda, MD;  Location: [facility];  Service: General;  Laterality: N/A;   IRRIGATION AND DEBRIDEMENT ABSCESS N/A 09/22/2015   Procedure: anoscope IRRIGATION AND DEBRIDEMENT ABSCESS;  Surgeon: Claud Kelp, MD;  Location: [facility];  Service: General;  Laterality: N/A;   KNEE ARTHROSCOPY Right    MOUTH SURGERY     RECTAL EXAM UNDER ANESTHESIA N/A 01/27/2016   Procedure: RECTAL EXAM UNDER ANESTHESIA;  Surgeon: Karie Soda, MD;  Location: [facility];  Service: General;  Laterality: N/A;   Social History:  reports that he has been smoking cigarettes. He has a 20.00 pack-year smoking history. He has quit using smokeless tobacco.  His smokeless tobacco use included chew. He reports that he does not drink alcohol and does not use drugs. Family History:  Family History  Problem Relation Age of Onset   Glaucoma Mother    Heart disease Father    Hyperlipidemia Father    Hypertension Father    Heart failure Father    Hyperthyroidism Father    Diabetes Maternal Grandmother    Glaucoma Maternal Grandmother    Diabetes Maternal Grandfather    Diabetes Paternal Grandmother    Hyperthyroidism  Paternal Grandmother    Diabetes Paternal Grandfather    COPD Paternal Grandfather    Lung cancer Paternal Grandfather      HOME MEDICATIONS: Allergies as of 11/25/2020       Reactions   Oxycodone Nausea Only        Medication List        Accurate as of November 25, 2020  7:26 AM. If you have any questions, ask your nurse or doctor.          atenolol 50 MG tablet Commonly known as: TENORMIN Take 1 tablet (50 mg total) by mouth daily.          OBJECTIVE:   PHYSICAL EXAM: VS: There were no vitals taken for this visit.   EXAM: General: Pt appears well and is in NAD  Eyes: External  eye exam normal without stare, lid lag or exophthalmos.  EOM intact.   Neck: General: Supple without adenopathy. Thyroid: Thyroid is prominent.  No nodules appreciated. No thyroid bruit.  Lungs: Clear with good BS bilat with no rales, rhonchi, or wheezes  Heart: Auscultation: RRR.  Abdomen: Normoactive bowel sounds, soft, nontender, without masses or organomegaly palpable  Extremities:  BL LE: No pretibial edema normal ROM and strength.  Mental Status: Judgment, insight: Intact Orientation: Oriented to time, place, and person Mood and affect: No depression, anxiety, or agitation     DATA REVIEWED: Results for DANTE, ROUDEBUSH (MRN [id number]) as of 07/22/2020 11:09  Ref. Range 07/21/2020 08:53  TSH Latest Ref Range: 0.35 - 4.50 uIU/mL <0.01 (L)  T4,Free(Direct) Latest Ref Range: 0.60 - 1.60 ng/dL 7.93   Results for RUCKER, PRIDGEON (MRN [id number]) as of 03/22/2020 07:31  Ref. Range 03/19/2020 11:01  Sodium Latest Ref Range: 135 - 145 mEq/L 140  Potassium Latest Ref Range: 3.5 - 5.1 mEq/L 4.1  Chloride Latest Ref Range: 96 - 112 mEq/L 104  CO2 Latest Ref Range: 19 - 32 mEq/L 30  Glucose Latest Ref Range: 70 - 99 mg/dL 82  BUN Latest Ref Range: 6 - 23 mg/dL 8  Creatinine Latest Ref Range: 0.40 - 1.50 mg/dL 0.07  Calcium Latest Ref Range: 8.4 - 10.5 mg/dL 9.2  Alkaline Phosphatase Latest Ref Range: 39 - 117 U/L 100  Albumin Latest Ref Range: 3.5 - 5.2 g/dL 4.6  AST Latest Ref Range: 0 - 37 U/L 16  ALT Latest Ref Range: 0 - 53 U/L 18  Total Protein Latest Ref Range: 6.0 - 8.3 g/dL 7.0  Total Bilirubin Latest Ref Range: 0.2 - 1.2 mg/dL 0.9  GFR Latest Ref Range: >60.00 mL/min 83.25  WBC Latest Ref Range: 4.0 - 10.5 K/uL 8.2  RBC Latest Ref Range: 4.22 - 5.81 Mil/uL 5.10  Hemoglobin Latest Ref Range: 13.0 - 17.0 g/dL 62.2  HCT Latest Ref Range: 39 - 52 % 43.6  MCV Latest Ref Range: 78.0 - 100.0 fl 85.4  MCHC Latest Ref Range: 30.0 - 36.0 g/dL 63.3  RDW Latest Ref Range: 11.5 - 15.5 %  15.2  Platelets Latest Ref Range: 150 - 400 K/uL 260.0  Neutrophils Latest Ref Range: 43 - 77 % 64.1  Lymphocytes Latest Ref Range: 12 - 46 % 26.5  Monocytes Relative Latest Ref Range: 3 - 12 % 5.3  Eosinophil Latest Ref Range: 0 - 5 % 3.0  Basophil Latest Ref Range: 0 - 3 % 1.1  NEUT# Latest Ref Range: 1.4 - 7.7 K/uL 5.2  Lymphocyte # Latest Ref Range: 0.7 - 4.0 K/uL 2.2  Monocyte # Latest Ref Range: 0.1 - 1.0 K/uL 0.4  Eosinophils Absolute Latest Ref Range: 0.0 - 0.7 K/uL 0.2  Basophils Absolute Latest Ref Range: 0.0 - 0.1 K/uL 0.1    ASSESSMENT / PLAN / RECOMMENDATIONS:   Hyperthyroidism Secondary to Graves' Disease:    - S/P RAI ablation  on 09/01/2020 - He is clinically euthyroid  - No local neck symptoms  -TSH continues to be suppressed but T4 is normal, we will continue to monitor   Medications  Continue atenolol 50 mg daily   2. Graves' Disease:    - No extra-thyroidal manifestations of graves' disease.       F/u in 4 months  Labs in 8 weeks        Signed electronically by: Lyndle Herrlich, MD  [facility] Endocrinology  [facility] [place] [facility] [address] [place], [place] [address] Phone: [phone] FAX: [phone]      CC: Anne Ng, NP [address] [place] [address] Phone: [phone]  Fax: [phone]   Return to Endocrinology clinic as below: Future Appointments  Date Time Provider Department Center  11/25/2020  9:10 AM Darienne Belleau, Konrad Dolores, MD [facility] None

## 2021-01-20 ENCOUNTER — Other Ambulatory Visit: Payer: No Typology Code available for payment source

## 2021-03-31 ENCOUNTER — Other Ambulatory Visit: Payer: Self-pay

## 2021-03-31 ENCOUNTER — Ambulatory Visit: Payer: No Typology Code available for payment source | Admitting: Internal Medicine

## 2021-03-31 VITALS — BP 136/70 | HR 76 | Ht 72.0 in | Wt 205.8 lb

## 2021-03-31 DIAGNOSIS — E0500 Thyrotoxicosis with diffuse goiter without thyrotoxic crisis or storm: Secondary | ICD-10-CM

## 2021-03-31 DIAGNOSIS — E0590 Thyrotoxicosis, unspecified without thyrotoxic crisis or storm: Secondary | ICD-10-CM

## 2021-03-31 LAB — CBC WITH DIFFERENTIAL/PLATELET
Basophils Absolute: 0 10*3/uL (ref 0.0–0.1)
Basophils Relative: 0.2 % (ref 0.0–3.0)
Eosinophils Absolute: 0.2 10*3/uL (ref 0.0–0.7)
Eosinophils Relative: 3.7 % (ref 0.0–5.0)
HCT: 43.2 % (ref 39.0–52.0)
Hemoglobin: 14.2 g/dL (ref 13.0–17.0)
Lymphocytes Relative: 31.4 % (ref 12.0–46.0)
Lymphs Abs: 1.6 10*3/uL (ref 0.7–4.0)
MCHC: 32.8 g/dL (ref 30.0–36.0)
MCV: 83.5 fl (ref 78.0–100.0)
Monocytes Absolute: 0.4 10*3/uL (ref 0.1–1.0)
Monocytes Relative: 8.1 % (ref 3.0–12.0)
Neutro Abs: 2.9 10*3/uL (ref 1.4–7.7)
Neutrophils Relative %: 56.6 % (ref 43.0–77.0)
Platelets: 235 10*3/uL (ref 150.0–400.0)
RBC: 5.17 Mil/uL (ref 4.22–5.81)
RDW: 12.9 % (ref 11.5–15.5)
WBC: 5.2 10*3/uL (ref 4.0–10.5)

## 2021-03-31 LAB — COMPREHENSIVE METABOLIC PANEL
ALT: 21 U/L (ref 0–53)
AST: 17 U/L (ref 0–37)
Albumin: 4.2 g/dL (ref 3.5–5.2)
Alkaline Phosphatase: 107 U/L (ref 39–117)
BUN: 14 mg/dL (ref 6–23)
CO2: 29 mEq/L (ref 19–32)
Calcium: 9.7 mg/dL (ref 8.4–10.5)
Chloride: 106 mEq/L (ref 96–112)
Creatinine, Ser: 0.77 mg/dL (ref 0.40–1.50)
GFR: 110.23 mL/min (ref >60.00)
Glucose, Bld: 92 mg/dL (ref 70–99)
Potassium: 4.6 mEq/L (ref 3.5–5.1)
Sodium: 140 mEq/L (ref 135–145)
Total Bilirubin: 1.1 mg/dL (ref 0.2–1.2)
Total Protein: 6.4 g/dL (ref 6.0–8.3)

## 2021-03-31 LAB — TSH: TSH: 0 u[IU]/mL — ABNORMAL LOW (ref 0.35–5.50)

## 2021-03-31 LAB — T4, FREE: Free T4: 4.58 ng/dL — ABNORMAL HIGH (ref 0.60–1.60)

## 2021-03-31 NOTE — Progress Notes (Signed)
Name: Roberto Mason  MRN/ DOB: [id number], 05-Apr-1981    Age/ Sex: 40 y.o., male     PCP: Nche, Bonna Gains, NP   Reason for Endocrinology Evaluation: Hyperthyroidism     Initial Endocrinology Clinic Visit: 10/23/2018    PATIENT IDENTIFIER: [name] is a 40 y.o., male with a past medical history of GERD. He has followed with [facility] since 10/23/18 for consultative assistance with management of his Hyperthyroidism  HISTORICAL SUMMARY: The patient presented to his PCP in May, 2020 with c/o sob that started a month prior to his presentation associated with palpitations. He was found to have a suppressed TSH  At <0.01 uIU/mL . Repeat TFT's confirmed hyperthyroidism diagnosis with suppressed TSH and elevated FT4 at 3.3 ng/dL.      He was started on Methimazole 15 mg daily in May, 2020  Thyroid uptake and scan 07/2020 showed a 24 hour I-123 uptake = 59.4% (normal 10-30%)  S/P RAI ablation on 09/01/2020 with 12.7 mCi I-131 sodium iodide orally    Father and paternal grandmother with hyperthyroidism   SUBJECTIVE:    Today (03/31/2021):  Roberto Mason is here for a follow up on his hyperthyroidism.   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   Tobacco abuse    Past Surgical History:  Past Surgical History:  Procedure Laterality Date   FISTULA PLUG N/A 01/27/2016   Procedure: REPAIR OF PERIRECTAL FISTULA  intersphincteric fistula;  Surgeon: Karie Soda, MD;  Location: [facility];  Service: General;  Laterality: N/A;   IRRIGATION AND DEBRIDEMENT ABSCESS N/A 09/22/2015   Procedure: anoscope IRRIGATION AND DEBRIDEMENT ABSCESS;  Surgeon: Claud Kelp, MD;  Location: [facility];  Service: General;  Laterality: N/A;   KNEE ARTHROSCOPY Right    MOUTH SURGERY     RECTAL EXAM UNDER ANESTHESIA N/A 01/27/2016   Procedure: RECTAL EXAM UNDER ANESTHESIA;  Surgeon: Karie Soda, MD;  Location: [facility];  Service: General;  Laterality: N/A;   Social History:  reports that he has been smoking cigarettes. He has a 20.00 pack-year smoking history. He has quit using smokeless tobacco.  His smokeless tobacco use included chew. He reports that he does not drink alcohol and does not use drugs. Family History:  Family History  Problem Relation Age of Onset   Glaucoma Mother    Heart disease Father    Hyperlipidemia Father    Hypertension Father    Heart failure Father    Hyperthyroidism Father    Diabetes Maternal Grandmother    Glaucoma Maternal Grandmother    Diabetes Maternal Grandfather    Diabetes Paternal Grandmother    Hyperthyroidism Paternal Grandmother    Diabetes Paternal Grandfather    COPD Paternal Grandfather    Lung cancer Paternal Grandfather      HOME MEDICATIONS: Allergies as of 03/31/2021  Reactions   Oxycodone Nausea Only        Medication List        Accurate as of March 31, 2021  9:46 AM. If you have any questions, ask your nurse or doctor.          atenolol 50 MG tablet Commonly known as: TENORMIN Take 1 tablet (50 mg total) by mouth daily.          OBJECTIVE:   PHYSICAL EXAM: VS: BP 136/70 (BP Location: Left Arm, Patient Position: Sitting, Cuff Size: Normal)   Pulse 76   Ht 6' (1.829 m)   Wt 205 lb 12.8 oz (93.4 kg)   SpO2 96%   BMI 27.91 kg/m    EXAM: General: Pt appears well and is in NAD  Eyes: External eye exam normal without stare, lid lag or  exophthalmos.  EOM intact.   Neck: General: Supple without adenopathy. Thyroid: Thyroid is prominent.  No nodules appreciated. No thyroid bruit.  Lungs: Clear with good BS bilat with no rales, rhonchi, or wheezes  Heart: Auscultation: RRR.  Abdomen: Normoactive bowel sounds, soft, nontender, without masses or organomegaly palpable  Extremities:  BL LE: No pretibial edema normal ROM and strength.  Mental Status: Judgment, insight: Intact Orientation: Oriented to time, place, and person Mood and affect: No depression, anxiety, or agitation     DATA REVIEWED: Results for Roberto Mason, Roberto Mason (MRN [id number]) as of 07/22/2020 11:09  Ref. Range 07/21/2020 08:53  TSH Latest Ref Range: 0.35 - 4.50 uIU/mL <0.01 (L)  T4,Free(Direct) Latest Ref Range: 0.60 - 1.60 ng/dL 3.30   Results for Roberto Mason, Roberto Mason (MRN [id number]) as of 03/22/2020 07:31  Ref. Range 03/19/2020 11:01  Sodium Latest Ref Range: 135 - 145 mEq/L 140  Potassium Latest Ref Range: 3.5 - 5.1 mEq/L 4.1  Chloride Latest Ref Range: 96 - 112 mEq/L 104  CO2 Latest Ref Range: 19 - 32 mEq/L 30  Glucose Latest Ref Range: 70 - 99 mg/dL 82  BUN Latest Ref Range: 6 - 23 mg/dL 8  Creatinine Latest Ref Range: 0.40 - 1.50 mg/dL 5.45  Calcium Latest Ref Range: 8.4 - 10.5 mg/dL 9.2  Alkaline Phosphatase Latest Ref Range: 39 - 117 U/L 100  Albumin Latest Ref Range: 3.5 - 5.2 g/dL 4.6  AST Latest Ref Range: 0 - 37 U/L 16  ALT Latest Ref Range: 0 - 53 U/L 18  Total Protein Latest Ref Range: 6.0 - 8.3 g/dL 7.0  Total Bilirubin Latest Ref Range: 0.2 - 1.2 mg/dL 0.9  GFR Latest Ref Range: >60.00 mL/min 83.25  WBC Latest Ref Range: 4.0 - 10.5 K/uL 8.2  RBC Latest Ref Range: 4.22 - 5.81 Mil/uL 5.10  Hemoglobin Latest Ref Range: 13.0 - 17.0 g/dL 62.5  HCT Latest Ref Range: 39 - 52 % 43.6  MCV Latest Ref Range: 78.0 - 100.0 fl 85.4  MCHC Latest Ref Range: 30.0 - 36.0 g/dL 63.8  RDW Latest Ref Range: 11.5 - 15.5 % 15.2  Platelets Latest Ref Range: 150 - 400  K/uL 260.0  Neutrophils Latest Ref Range: 43 - 77 % 64.1  Lymphocytes Latest Ref Range: 12 - 46 % 26.5  Monocytes Relative Latest Ref Range: 3 - 12 % 5.3  Eosinophil Latest Ref Range: 0 - 5 % 3.0  Basophil Latest Ref Range: 0 - 3 % 1.1  NEUT# Latest Ref Range: 1.4 - 7.7 K/uL 5.2  Lymphocyte # Latest Ref Range: 0.7 - 4.0 K/uL 2.2  Monocyte # Latest Ref  Range: 0.1 - 1.0 K/uL 0.4  Eosinophils Absolute Latest Ref Range: 0.0 - 0.7 K/uL 0.2  Basophils Absolute Latest Ref Range: 0.0 - 0.1 K/uL 0.1    ASSESSMENT / PLAN / RECOMMENDATIONS:   Hyperthyroidism Secondary to Graves' Disease:    - S/P RAI ablation  on 09/01/2020 - He is clinically hyperthyroid - No local neck symptoms  - His TFTs continue to show hyperthyroidism, unfortunately this is an indication that RAI has not been effective.  We did explore another dose of radioactive iodine ablation, total thyroidectomy, versus methimazole -He would like to explore surgical options, I am going to refer him to general surgery for further discussion, in the meantime we will restart his methimazole as below  Medications  Continue atenolol 50 mg daily Restart methimazole 10 mg daily  2. Graves' Disease:    - No extra-thyroidal manifestations of graves' disease.       F/u in 4 months     Addendum: Lab results discussed with the patient on 04/01/2021.  A referral has been placed to Dr. Darnell Level   Signed electronically by: Lyndle Herrlich, MD  [facility] Endocrinology  [facility] [address] [place], [place] [address] Phone: [phone] FAX: [phone]      CC: Anne Ng, NP [address] [place] [address] Phone: [phone]  Fax: [phone]   Return to Endocrinology clinic as below: No future appointments.

## 2021-04-01 LAB — T3: T3, Total: 522 ng/dL — ABNORMAL HIGH (ref 76–181)

## 2021-04-01 MED ORDER — METHIMAZOLE 10 MG PO TABS: 10.0000 mg | ORAL_TABLET | Freq: Three times a day (TID) | ORAL | 1 refills | Status: DC

## 2021-06-04 ENCOUNTER — Other Ambulatory Visit: Payer: Self-pay | Admitting: Internal Medicine

## 2021-07-05 ENCOUNTER — Other Ambulatory Visit: Payer: Self-pay | Admitting: Internal Medicine

## 2021-07-21 ENCOUNTER — Telehealth: Payer: Self-pay

## 2021-07-21 ENCOUNTER — Other Ambulatory Visit: Payer: Self-pay | Admitting: Internal Medicine

## 2021-07-21 ENCOUNTER — Other Ambulatory Visit: Payer: Self-pay

## 2021-07-21 ENCOUNTER — Other Ambulatory Visit (INDEPENDENT_AMBULATORY_CARE_PROVIDER_SITE_OTHER): Payer: PRIVATE HEALTH INSURANCE

## 2021-07-21 DIAGNOSIS — E0590 Thyrotoxicosis, unspecified without thyrotoxic crisis or storm: Secondary | ICD-10-CM

## 2021-07-21 LAB — T4, FREE: Free T4: 0.18 ng/dL — ABNORMAL LOW (ref 0.60–1.60)

## 2021-07-21 LAB — TSH: TSH: 31.47 u[IU]/mL — ABNORMAL HIGH (ref 0.35–5.50)

## 2021-07-21 NOTE — Telephone Encounter (Signed)
Patient states that he has been having night sweats and that his heart rate was 60. He states that it has never been that low. He does have a follow up with you on 07/29/21. He wants to know he needs to change any of his medication or just wait to follow up appointment to discuss. ?

## 2021-07-21 NOTE — Telephone Encounter (Signed)
Patient will come in today for labs.

## 2021-07-29 ENCOUNTER — Ambulatory Visit: Payer: PRIVATE HEALTH INSURANCE | Admitting: Internal Medicine

## 2021-07-29 ENCOUNTER — Encounter: Payer: Self-pay | Admitting: Internal Medicine

## 2021-07-29 ENCOUNTER — Other Ambulatory Visit: Payer: Self-pay

## 2021-07-29 VITALS — BP 120/74 | HR 74 | Ht 72.0 in | Wt 219.0 lb

## 2021-07-29 DIAGNOSIS — E0500 Thyrotoxicosis with diffuse goiter without thyrotoxic crisis or storm: Secondary | ICD-10-CM

## 2021-07-29 DIAGNOSIS — E0590 Thyrotoxicosis, unspecified without thyrotoxic crisis or storm: Secondary | ICD-10-CM | POA: Diagnosis not present

## 2021-07-29 LAB — COMPREHENSIVE METABOLIC PANEL
ALT: 18 U/L (ref 0–53)
AST: 15 U/L (ref 0–37)
Albumin: 4.9 g/dL (ref 3.5–5.2)
Alkaline Phosphatase: 107 U/L (ref 39–117)
BUN: 20 mg/dL (ref 6–23)
CO2: 28 mEq/L (ref 19–32)
Calcium: 9.8 mg/dL (ref 8.4–10.5)
Chloride: 103 mEq/L (ref 96–112)
Creatinine, Ser: 1.03 mg/dL (ref 0.40–1.50)
GFR: 89.23 mL/min (ref >60.00)
Glucose, Bld: 89 mg/dL (ref 70–99)
Potassium: 4.1 mEq/L (ref 3.5–5.1)
Sodium: 140 mEq/L (ref 135–145)
Total Bilirubin: 1.3 mg/dL — ABNORMAL HIGH (ref 0.2–1.2)
Total Protein: 7.2 g/dL (ref 6.0–8.3)

## 2021-07-29 LAB — T4, FREE: Free T4: 0.65 ng/dL (ref 0.60–1.60)

## 2021-07-29 LAB — CBC WITH DIFFERENTIAL/PLATELET
Basophils Absolute: 0 10*3/uL (ref 0.0–0.1)
Basophils Relative: 0.5 % (ref 0.0–3.0)
Eosinophils Absolute: 0.1 10*3/uL (ref 0.0–0.7)
Eosinophils Relative: 1.3 % (ref 0.0–5.0)
HCT: 44.5 % (ref 39.0–52.0)
Hemoglobin: 14.9 g/dL (ref 13.0–17.0)
Lymphocytes Relative: 28.5 % (ref 12.0–46.0)
Lymphs Abs: 2.2 10*3/uL (ref 0.7–4.0)
MCHC: 33.4 g/dL (ref 30.0–36.0)
MCV: 90.8 fl (ref 78.0–100.0)
Monocytes Absolute: 0.6 10*3/uL (ref 0.1–1.0)
Monocytes Relative: 7.3 % (ref 3.0–12.0)
Neutro Abs: 4.8 10*3/uL (ref 1.4–7.7)
Neutrophils Relative %: 62.4 % (ref 43.0–77.0)
Platelets: 218 10*3/uL (ref 150.0–400.0)
RBC: 4.9 Mil/uL (ref 4.22–5.81)
RDW: 16.1 % — ABNORMAL HIGH (ref 11.5–15.5)
WBC: 7.7 10*3/uL (ref 4.0–10.5)

## 2021-07-29 LAB — TSH: TSH: 0.27 u[IU]/mL — ABNORMAL LOW (ref 0.35–5.50)

## 2021-07-29 NOTE — Patient Instructions (Signed)
? ? ?  [facility] located at [address] [place]  ? ?

## 2021-07-29 NOTE — Progress Notes (Signed)
? ?Name: Roberto Mason  ?MRN/ DOB: [id number], 01/13/1981    ?Age/ Sex: 41 y.o., male   ? ? ?PCP: Nche, Bonna Gains, NP   ?Reason for Endocrinology Evaluation: Hyperthyroidism  ?   ?Initial Endocrinology Clinic Visit: 10/23/2018  ? ? ?PATIENT IDENTIFIER: [name] is a 41 y.o., male with a past medical history of GERD. He has followed with [facility] since 10/23/18 for consultative assistance with management of his Hyperthyroidism ? ?HISTORICAL SUMMARY: The patient presented to his PCP in May, 2020 with c/o sob that started a month prior to his presentation associated with palpitations. He was found to have a suppressed TSH  At <0.01 uIU/mL . Repeat TFT's confirmed hyperthyroidism diagnosis with suppressed TSH and elevated FT4 at 3.3 ng/dL.  ?  ?  ?He was started on Methimazole 15 mg daily in May, 2020 ? ?Thyroid uptake and scan 07/2020 showed a 24 hour I-123 uptake = 59.4% (normal 10-30%) ? ?S/P RAI ablation on 09/01/2020 with 12.7 mCi I-131 sodium iodide orally ? ?Restarted Methimazole 03/2021 due to persistent hyperthyroidism ? ?Father and paternal grandmother with hyperthyroidism ?  ?SUBJECTIVE:  ? ? ?Today (07/29/2021):  [name] is here for a follow up on his hyperthyroidism.  ? ?He is S/P RAI ablation on 09/01/2020 with 12.7 mCi I-131 sodium iodide orally ? ?Due to persistent hyperthyroidism he was restarted on Methimazole which was reduced last week  ? ? ?He has been noted with weight gain  ?Has noted excessive sweating every night with cold intolerance for the last 2-3 weeks  ? tremors resolved  ?Denies diarrhea or constipation   ?Denies palpitations  ?Denies local neck symptoms  ?Has continues with  occasional watery eyes  ? ?He is scheduled to see the surgeon on  3/23rd.  ? ? ? ? ?HISTORY:  ?Past Medical History:  ?Past Medical History:  ?Diagnosis Date  ? Allergy   ? Cold 01/25/2016  ? pt states x 2 weeks with lingering cough about 10x day-  no fever, nonproductive  ? GERD  (gastroesophageal reflux disease)   ? PONV (postoperative nausea and vomiting)   ? Rectal fistula   ? secondary to perirectal abscess 86months ago  ? Tobacco abuse   ? ?Past Surgical History:  ?Past Surgical History:  ?Procedure Laterality Date  ? FISTULA PLUG N/A 01/27/2016  ? Procedure: REPAIR OF PERIRECTAL FISTULA intersphincteric fistula;  Surgeon: Karie Soda, MD;  [facility]: [facility];  Service: General;  Laterality: N/A;  ? IRRIGATION AND DEBRIDEMENT ABSCESS N/A 09/22/2015  ? Procedure: anoscope IRRIGATION AND DEBRIDEMENT ABSCESS;  Surgeon: Claud Kelp, MD;  [facility];  Service: General;  Laterality: N/A;  ? KNEE ARTHROSCOPY Right   ? MOUTH SURGERY    ? RECTAL EXAM UNDER ANESTHESIA N/A 01/27/2016  ? Procedure: RECTAL EXAM UNDER ANESTHESIA;  Surgeon: Karie Soda, MD;  Location: [facility];  Service: General;  Laterality: N/A;  ? ?Social History:  reports that he has been smoking cigarettes. He has a 20.00 pack-year smoking history. He has quit using smokeless tobacco.  His smokeless tobacco use included chew. He reports that he does not drink alcohol and does not use drugs. ?Family History:  ?Family History  ?Problem Relation Age of Onset  ? Glaucoma Mother   ? Heart disease Father   ? Hyperlipidemia Father   ? Hypertension Father   ? Heart failure Father   ? Hyperthyroidism Father   ? Diabetes Maternal Grandmother   ? Glaucoma Maternal Grandmother   ?  Diabetes Maternal Grandfather   ? Diabetes Paternal Grandmother   ? Hyperthyroidism Paternal Grandmother   ? Diabetes Paternal Grandfather   ? COPD Paternal Grandfather   ? Lung cancer Paternal Grandfather   ? ? ? ?HOME MEDICATIONS: ?Allergies as of 07/29/2021   ? ?   Reactions  ? Oxycodone Nausea Only  ? ?  ? ?  ?Medication List  ?  ? ?  ? Accurate as of July 29, 2021  9:18 AM. If you have any questions, ask your nurse or doctor.  ?  ?  ? ?  ? ?methimazole 10 MG tablet ?Commonly known as: TAPAZOLE ?TAKE 1 TABLET BY MOUTH THREE TIMES A DAY ?  ? ?   ? ? ? ? ?OBJECTIVE:  ? ?PHYSICAL EXAM: ?VS: BP 120/74 (BP Location: Left Arm, Patient Position: Sitting, Cuff Size: Small)   Pulse 74   Ht 6' (1.829 m)   Wt 219 lb (99.3 kg)   SpO2 97%   BMI 29.70 kg/m?  ? ? ?EXAM: ?General: Pt appears well and is in NAD  ?Eyes: External eye exam normal without stare, lid lag or exophthalmos.  EOM intact.   ?Neck: General: Supple without adenopathy. ?Thyroid: Thyroid is prominent.  No nodules appreciated. No thyroid bruit.  ?Lungs: Clear with good BS bilat with no rales, rhonchi, or wheezes  ?Heart: Auscultation: RRR.  ?Abdomen: Normoactive bowel sounds, soft, nontender, without masses or organomegaly palpable  ?Extremities:  ?BL LE: No pretibial edema normal ROM and strength.  ?Mental Status: Judgment, insight: Intact ?Orientation: Oriented to time, place, and person ?Mood and affect: No depression, anxiety, or agitation  ? ? ? ?DATA REVIEWED: ? Latest Reference Range & Units 07/29/21 09:36  ?Sodium 135 - 145 mEq/L 140  ?Potassium 3.5 - 5.1 mEq/L 4.1  ?Chloride 96 - 112 mEq/L 103  ?CO2 19 - 32 mEq/L 28  ?Glucose 70 - 99 mg/dL 89  ?BUN 6 - 23 mg/dL 20  ?Creatinine 0.40 - 1.50 mg/dL 5.621.03  ?Calcium 8.4 - 10.5 mg/dL 9.8  ?Alkaline Phosphatase 39 - 117 U/L 107  ?Albumin 3.5 - 5.2 g/dL 4.9  ?AST 0 - 37 U/L 15  ?ALT 0 - 53 U/L 18  ?Total Protein 6.0 - 8.3 g/dL 7.2  ?Total Bilirubin 0.2 - 1.2 mg/dL 1.3 (H)  ?GFR >60.00 mL/min 89.23  ? ? Latest Reference Range & Units 07/29/21 09:36  ?WBC 4.0 - 10.5 K/uL 7.7  ?RBC 4.22 - 5.81 Mil/uL 4.90  ?Hemoglobin 13.0 - 17.0 g/dL 13.014.9  ?HCT 39.0 - 52.0 % 44.5  ?MCV 78.0 - 100.0 fl 90.8  ?MCHC 30.0 - 36.0 g/dL 86.533.4  ?RDW 11.5 - 15.5 % 16.1 (H)  ?Platelets 150.0 - 400.0 K/uL 218.0  ?Neutrophils 43.0 - 77.0 % 62.4  ?Lymphocytes 12.0 - 46.0 % 28.5  ?Monocytes Relative 3.0 - 12.0 % 7.3  ?Eosinophil 0.0 - 5.0 % 1.3  ?Basophil 0.0 - 3.0 % 0.5  ?NEUT# 1.4 - 7.7 K/uL 4.8  ?Lymphocyte # 0.7 - 4.0 K/uL 2.2  ?Monocyte # 0.1 - 1.0 K/uL 0.6  ?Eosinophils  Absolute 0.0 - 0.7 K/uL 0.1  ?Basophils Absolute 0.0 - 0.1 K/uL 0.0  ?Glucose 70 - 99 mg/dL 89  ?TSH 0.35 - 5.50 uIU/mL 0.27 (L)  ?T4,Free(Direct) 0.60 - 1.60 ng/dL 7.840.65  ? ? ?ASSESSMENT / PLAN / RECOMMENDATIONS:  ? ?Hyperthyroidism Secondary to Graves' Disease:  ? ? ?- S/P RAI ablation  on 09/01/2020 with persistent hyperthyroid, we started methimazole 03/2021 ?-Last week he  called with fatigue TSH was 31 you IU/mL, we reduce methimazole from 10 mg to 5 mg ?-Repeat TSH today is low, free T4 at the lower end of normal ?-He has an appointment with surgery later this month, I have asked him to keep his appointment ?-No changes today ?-Repeat labs in 3 weeks ? ? ? ?Medications  ? ?Continue methimazole 10 mg,  half a tablet daily ? ?2. Graves' Disease:  ?  ?- No extra-thyroidal manifestations of graves' disease.   ? ? ? ? ?F/u in 4 months  ?Labs in 3 weeks ? ? ?He was also given the address to the [facility] lab on [address] in case it is needed ? ?Signed electronically by: ?Abby Raelyn Mora, MD ? ? Endocrinology  ?[facility] ?[address] ?[place], [place] [address] ?Phone: [phone] ?FAX: [phone]  ? ? ? ? ?CC: ?Nche, Bonna Gains, NP ?[phone] ?[place] [address] ?Phone: [phone]  ?Fax: [phone] ? ? ?Return to Endocrinology clinic as below: ?No future appointments. ?  ? ? ?

## 2021-08-05 ENCOUNTER — Other Ambulatory Visit: Payer: Self-pay | Admitting: Internal Medicine

## 2021-08-19 ENCOUNTER — Other Ambulatory Visit (INDEPENDENT_AMBULATORY_CARE_PROVIDER_SITE_OTHER): Payer: PRIVATE HEALTH INSURANCE

## 2021-08-19 DIAGNOSIS — E0500 Thyrotoxicosis with diffuse goiter without thyrotoxic crisis or storm: Secondary | ICD-10-CM

## 2021-08-19 LAB — T4, FREE: Free T4: 1.43 ng/dL (ref 0.60–1.60)

## 2021-08-19 LAB — TSH: TSH: 0.01 u[IU]/mL — ABNORMAL LOW (ref 0.35–5.50)

## 2021-12-02 ENCOUNTER — Encounter: Payer: Self-pay | Admitting: Internal Medicine

## 2021-12-02 ENCOUNTER — Ambulatory Visit (INDEPENDENT_AMBULATORY_CARE_PROVIDER_SITE_OTHER): Payer: PRIVATE HEALTH INSURANCE | Admitting: Internal Medicine

## 2021-12-02 VITALS — BP 130/80 | HR 67 | Ht 72.0 in | Wt 219.0 lb

## 2021-12-02 DIAGNOSIS — E0590 Thyrotoxicosis, unspecified without thyrotoxic crisis or storm: Secondary | ICD-10-CM

## 2021-12-02 DIAGNOSIS — E0500 Thyrotoxicosis with diffuse goiter without thyrotoxic crisis or storm: Secondary | ICD-10-CM

## 2021-12-02 LAB — TSH: TSH: <0.01 u[IU]/mL — ABNORMAL LOW (ref 0.35–5.50)

## 2021-12-02 LAB — T4, FREE: Free T4: 2.46 ng/dL — ABNORMAL HIGH (ref 0.60–1.60)

## 2021-12-02 MED ORDER — METHIMAZOLE 5 MG PO TABS: 5.0000 mg | ORAL_TABLET | Freq: Every day | ORAL | 1 refills | Status: DC

## 2021-12-02 MED ORDER — METHIMAZOLE 5 MG PO TABS: 5.0000 mg | ORAL_TABLET | ORAL | 1 refills | Status: DC

## 2021-12-02 NOTE — Progress Notes (Signed)
Name: Roberto Mason  MRN/ DOB: [id number], 11-Jul-1980    Age/ Sex: 41 y.o., male     PCP: Nche, Bonna Gains, NP   Reason for Endocrinology Evaluation: Hyperthyroidism     Initial Endocrinology Clinic Visit: 10/23/2018    PATIENT IDENTIFIER: [name] is a 41 y.o., male with a past medical history of GERD. He has followed with [facility] since 10/23/18 for consultative assistance with management of his Hyperthyroidism     HISTORICAL SUMMARY: The patient presented to his PCP in May, 2020 with c/o sob that started a month prior to his presentation associated with palpitations. He was found to have a suppressed TSH  At <0.01 uIU/mL . Repeat TFT's confirmed hyperthyroidism diagnosis with suppressed TSH and elevated FT4 at 3.3 ng/dL.      He was started on Methimazole 15 mg daily in May, 2020  Thyroid uptake and scan 07/2020 showed a 24 hour I-123 uptake = 59.4% (normal 10-30%)  S/P RAI ablation on 09/01/2020 with 12.7 mCi I-131 sodium iodide orally  Restarted Methimazole 03/2021 due to persistent hyperthyroidism  Father and paternal grandmother with hyperthyroidism   SUBJECTIVE:    Today (12/02/2021):  Roberto Mason is here for a follow up on his hyperthyroidism.  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 57months ago   Tobacco abuse    Past Surgical History:  Past Surgical History:   Procedure Laterality Date   FISTULA PLUG N/A 01/27/2016   Procedure: REPAIR OF PERIRECTAL FISTULA intersphincteric fistula;  Surgeon: Karie Soda, MD;  Location: [facility];  Service: General;  Laterality: N/A;   IRRIGATION AND DEBRIDEMENT ABSCESS N/A 09/22/2015   Procedure: anoscope IRRIGATION AND DEBRIDEMENT ABSCESS;  Surgeon: Claud Kelp, MD;  Location: [facility];  Service: General;  Laterality: N/A;   KNEE ARTHROSCOPY Right    MOUTH SURGERY     RECTAL EXAM UNDER ANESTHESIA N/A 01/27/2016   Procedure: RECTAL EXAM UNDER ANESTHESIA;  Surgeon: Karie Soda, MD;  Location: [facility];  Service: General;  Laterality: N/A;   Social History:  reports that he has been smoking cigarettes. He has a 20.00 pack-year smoking history. He has quit using smokeless tobacco.  His smokeless tobacco use included chew. He reports that he does not drink alcohol and does not use drugs. Family History:  Family History  Problem Relation Age of Onset   Glaucoma Mother    Heart disease Father    Hyperlipidemia Father    Hypertension Father    Heart failure Father    Hyperthyroidism Father    Diabetes Maternal Grandmother    Glaucoma Maternal Grandmother    Diabetes Maternal Grandfather    Diabetes Paternal Grandmother    Hyperthyroidism Paternal Grandmother    Diabetes Paternal Grandfather    COPD Paternal Grandfather    Lung cancer Paternal Grandfather  HOME MEDICATIONS: Allergies as of 12/02/2021       Reactions   Oxycodone Nausea Only        Medication List        Accurate as of December 02, 2021 11:03 AM. If you have any questions, ask your nurse or doctor.          methimazole 10 MG tablet Commonly known as: TAPAZOLE TAKE 1 TABLET BY MOUTH THREE TIMES A DAY          OBJECTIVE:   PHYSICAL EXAM: VS: BP 130/80 (BP Location: Left Arm, Patient Position: Sitting, Cuff Size: Small)   Pulse 67   Ht 6' (1.829 m)   Wt 219 lb (99.3 kg)   SpO2 93%   BMI 29.70 kg/m    EXAM: General: Pt  appears well and is in NAD  Eyes: External eye exam normal without stare, lid lag or exophthalmos.  EOM intact.   Neck: General: Supple without adenopathy. Thyroid: Thyroid is prominent.  No nodules appreciated. No thyroid bruit.  Lungs: Clear with good BS bilat with no rales, rhonchi, or wheezes  Heart: Auscultation: RRR.  Abdomen: Normoactive bowel sounds, soft, nontender, without masses or organomegaly palpable  Extremities:  BL LE: No pretibial edema normal ROM and strength.  Mental Status: Judgment, insight: Intact Orientation: Oriented to time, place, and person Mood and affect: No depression, anxiety, or agitation     DATA REVIEWED:   Latest Reference Range & Units 12/02/21 10:23  TSH 0.35 - 5.50 uIU/mL <0.01 Repeated and verified X2. (L)  T4,Free(Direct) 0.60 - 1.60 ng/dL 1.88 (H)     Latest Reference Range & Units 07/29/21 09:36  Sodium 135 - 145 mEq/L 140  Potassium 3.5 - 5.1 mEq/L 4.1  Chloride 96 - 112 mEq/L 103  CO2 19 - 32 mEq/L 28  Glucose 70 - 99 mg/dL 89  BUN 6 - 23 mg/dL 20  Creatinine 4.16 - 6.06 mg/dL 3.01  Calcium 8.4 - 60.1 mg/dL 9.8  Alkaline Phosphatase 39 - 117 U/L 107  Albumin 3.5 - 5.2 g/dL 4.9  AST 0 - 37 U/L 15  ALT 0 - 53 U/L 18  Total Protein 6.0 - 8.3 g/dL 7.2  Total Bilirubin 0.2 - 1.2 mg/dL 1.3 (H)  GFR >09.32 mL/min 89.23    Latest Reference Range & Units 07/29/21 09:36  WBC 4.0 - 10.5 K/uL 7.7  RBC 4.22 - 5.81 Mil/uL 4.90  Hemoglobin 13.0 - 17.0 g/dL 35.5  HCT 73.2 - 20.2 % 44.5  MCV 78.0 - 100.0 fl 90.8  MCHC 30.0 - 36.0 g/dL 54.2  RDW 70.6 - 23.7 % 16.1 (H)  Platelets 150.0 - 400.0 K/uL 218.0  Neutrophils 43.0 - 77.0 % 62.4  Lymphocytes 12.0 - 46.0 % 28.5  Monocytes Relative 3.0 - 12.0 % 7.3  Eosinophil 0.0 - 5.0 % 1.3  Basophil 0.0 - 3.0 % 0.5  NEUT# 1.4 - 7.7 K/uL 4.8  Lymphocyte # 0.7 - 4.0 K/uL 2.2  Monocyte # 0.1 - 1.0 K/uL 0.6  Eosinophils Absolute 0.0 - 0.7 K/uL 0.1  Basophils Absolute 0.0 - 0.1 K/uL 0.0  Glucose  70 - 99 mg/dL 89  TSH 6.28 - 3.15 uIU/mL 0.27 (L)  T4,Free(Direct) 0.60 - 1.60 ng/dL 1.76    ASSESSMENT / PLAN / RECOMMENDATIONS:   Hyperthyroidism Secondary to Graves' Disease:    - S/P RAI ablation  on 09/01/2020 with persistent hyperthyroid, we started methimazole 03/2021 -He was supposed to meet up with the surgeon in March  2023 but changed his mind when his TFTs stabilized with methimazole -Patient with fatigue and weight gain - TFT's continue to show hyperthyroidism, in the past 10 mg because a TSH of 31 and a 5 mg is not enough.  I have suggested switching his methimazole from the 10 mg to the 5 mg and he will take it as below   Medications   Change methimazole to 5 mg, take 2 tabs daily Monday through Friday, 1 tablet Saturday and Sunday  2. Graves' Disease:    - No extra-thyroidal manifestations of graves' disease.       F/u in 6 months  Labs in 2 months Addendum: Discussed lab results with the patient on 12/02/2021 at 12:45 PM  Signed electronically by: Lyndle Herrlich, MD  [facility] Endocrinology  [facility] [address] [place] [place], [place] [address] Phone: [phone] FAX: [phone]      CC: Anne Ng, NP [address] [place] [address] Phone: [phone]  Fax: [phone]   Return to Endocrinology clinic as below: Future Appointments  Date Time Provider Department Center  06/05/2022  9:30 AM Guliana Weyandt, Konrad [facility], MD [id number] None

## 2021-12-03 LAB — T3: T3, Total: 235 ng/dL — ABNORMAL HIGH (ref 76–181)

## 2022-06-05 ENCOUNTER — Ambulatory Visit: Payer: PRIVATE HEALTH INSURANCE | Admitting: Internal Medicine

## 2022-06-05 ENCOUNTER — Encounter: Payer: Self-pay | Admitting: Internal Medicine

## 2022-06-05 VITALS — BP 124/80 | HR 95 | Ht 72.0 in | Wt 224.0 lb

## 2022-06-05 DIAGNOSIS — E0500 Thyrotoxicosis with diffuse goiter without thyrotoxic crisis or storm: Secondary | ICD-10-CM

## 2022-06-05 DIAGNOSIS — E0590 Thyrotoxicosis, unspecified without thyrotoxic crisis or storm: Secondary | ICD-10-CM | POA: Diagnosis not present

## 2022-06-05 LAB — TSH: TSH: 0 u[IU]/mL — ABNORMAL LOW (ref 0.35–5.50)

## 2022-06-05 LAB — T4, FREE: Free T4: 1.98 ng/dL — ABNORMAL HIGH (ref 0.60–1.60)

## 2022-06-05 NOTE — Progress Notes (Unsigned)
Name: Roberto Mason  MRN/ DOB: [id number], [birth year]    Age/ Sex: 42 y.o., male     PCP: Pcp, No   Reason for Endocrinology Evaluation: Hyperthyroidism     Initial Endocrinology Clinic Visit: 10/23/2018    PATIENT IDENTIFIER: [name] is a 42 y.o., male with a past medical history of GERD. He has followed with [facility] since 10/23/18 for consultative assistance with management of his Hyperthyroidism     HISTORICAL SUMMARY: The patient presented to his PCP in May, 2020 with c/o sob that started a month prior to his presentation associated with palpitations. He was found to have a suppressed TSH  At <0.01 uIU/mL . Repeat TFT's confirmed hyperthyroidism diagnosis with suppressed TSH and elevated FT4 at 3.3 ng/dL.      He was started on Methimazole 15 mg daily in May, 2020  Thyroid uptake and scan 07/2020 showed a 24 hour I-123 uptake = 59.4% (normal 10-30%)  S/P RAI ablation on 09/01/2020 with 12.7 mCi I-131 sodium iodide orally  Restarted Methimazole 03/2021 due to persistent hyperthyroidism  Father and paternal grandmother with hyperthyroidism   SUBJECTIVE:    Today (06/05/2022):  Roberto Mason is here for a follow up on his hyperthyroidism.  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   Tobacco abuse    Past Surgical History:  Past Surgical History:  Procedure  Laterality Date   FISTULA PLUG N/A 01/27/2016   Procedure: REPAIR OF PERIRECTAL FISTULA intersphincteric fistula;  Surgeon: Michael Boston, MD;  Location: [facility];  Service: General;  Laterality: N/A;   IRRIGATION AND DEBRIDEMENT ABSCESS N/A 09/22/2015   Procedure: anoscope IRRIGATION AND DEBRIDEMENT ABSCESS;  Surgeon: Fanny Skates, MD;  Location: [facility];  Service: General;  Laterality: N/A;   KNEE ARTHROSCOPY Right    MOUTH SURGERY     RECTAL EXAM UNDER ANESTHESIA N/A 01/27/2016   Procedure: RECTAL EXAM UNDER ANESTHESIA;  Surgeon: Michael Boston, MD;  Location: [facility];  Service: General;  Laterality: N/A;   Social History:  reports that he has been smoking cigarettes. He has a 20.00 pack-year smoking history. He has quit using smokeless tobacco.  His smokeless tobacco use included chew. He reports that he does not drink alcohol and does not use drugs. Family History:  Family History  Problem Relation Age of Onset   Glaucoma Mother    Heart disease Father    Hyperlipidemia Father    Hypertension Father    Heart failure Father    Hyperthyroidism Father    Diabetes Maternal Grandmother    Glaucoma Maternal Grandmother    Diabetes Maternal Grandfather    Diabetes Paternal Grandmother    Hyperthyroidism Paternal Grandmother    Diabetes Paternal Grandfather    COPD Paternal Grandfather    Lung cancer Paternal Grandfather      HOME  MEDICATIONS: Allergies as of 06/05/2022       Reactions   Oxycodone Nausea Only        Medication List        Accurate as of June 05, 2022  9:46 AM. If you have any questions, ask your nurse or doctor.          methimazole 5 MG tablet Commonly known as: TAPAZOLE Take 1 tablet (5 mg total) by mouth as directed. 2 tablets Monday through Friday, 1 tablet on Saturday and Sunday          OBJECTIVE:   PHYSICAL EXAM: VS: BP 124/80 (BP Location: Left Arm, Patient Position: Sitting, Cuff Size: Large)   Pulse 95   Ht 6' (1.829 m)   Wt 224 lb  (101.6 kg)   SpO2 97%   BMI 30.38 kg/m    EXAM: General: Pt appears well and is in NAD  Eyes: External eye exam normal without stare, lid lag or exophthalmos.  EOM intact.   Neck: General: Supple without adenopathy. Thyroid: Thyroid is prominent.  No nodules appreciated. No thyroid bruit.  Lungs: Clear with good BS bilat with no rales, rhonchi, or wheezes  Heart: Auscultation: RRR.  Abdomen: Normoactive bowel sounds, soft, nontender, without masses or organomegaly palpable  Extremities:  BL LE: No pretibial edema normal ROM and strength.  Mental Status: Judgment, insight: Intact Orientation: Oriented to time, place, and person Mood and affect: No depression, anxiety, or agitation     DATA REVIEWED:   Latest Reference Range & Units 12/02/21 10:23  TSH 0.35 - 5.50 uIU/mL <0.01 Repeated and verified X2. (L)  T4,Free(Direct) 0.60 - 1.60 ng/dL 3.14 (H)     Latest Reference Range & Units 07/29/21 09:36  Sodium 135 - 145 mEq/L 140  Potassium 3.5 - 5.1 mEq/L 4.1  Chloride 96 - 112 mEq/L 103  CO2 19 - 32 mEq/L 28  Glucose 70 - 99 mg/dL 89  BUN 6 - 23 mg/dL 20  Creatinine 9.70 - 2.63 mg/dL 7.85  Calcium 8.4 - 88.5 mg/dL 9.8  Alkaline Phosphatase 39 - 117 U/L 107  Albumin 3.5 - 5.2 g/dL 4.9  AST 0 - 37 U/L 15  ALT 0 - 53 U/L 18  Total Protein 6.0 - 8.3 g/dL 7.2  Total Bilirubin 0.2 - 1.2 mg/dL 1.3 (H)  GFR >02.77 mL/min 89.23    Latest Reference Range & Units 07/29/21 09:36  WBC 4.0 - 10.5 K/uL 7.7  RBC 4.22 - 5.81 Mil/uL 4.90  Hemoglobin 13.0 - 17.0 g/dL 41.2  HCT 87.8 - 67.6 % 44.5  MCV 78.0 - 100.0 fl 90.8  MCHC 30.0 - 36.0 g/dL 72.0  RDW 94.7 - 09.6 % 16.1 (H)  Platelets 150.0 - 400.0 K/uL 218.0  Neutrophils 43.0 - 77.0 % 62.4  Lymphocytes 12.0 - 46.0 % 28.5  Monocytes Relative 3.0 - 12.0 % 7.3  Eosinophil 0.0 - 5.0 % 1.3  Basophil 0.0 - 3.0 % 0.5  NEUT# 1.4 - 7.7 K/uL 4.8  Lymphocyte # 0.7 - 4.0 K/uL 2.2  Monocyte # 0.1 - 1.0 K/uL 0.6  Eosinophils Absolute 0.0  - 0.7 K/uL 0.1  Basophils Absolute 0.0 - 0.1 K/uL 0.0  Glucose 70 - 99 mg/dL 89  TSH 2.83 - 6.62 uIU/mL 0.27 (L)  T4,Free(Direct) 0.60 - 1.60 ng/dL 9.47    ASSESSMENT / PLAN / RECOMMENDATIONS:   Hyperthyroidism Secondary to Graves' Disease:    - S/P RAI ablation  on 09/01/2020 with persistent hyperthyroid, we started methimazole  03/2021 -He was supposed to meet up with the surgeon in March 2023 but changed his mind when his TFTs stabilized with methimazole -Patient with fatigue and weight gain - TFT's continue to show hyperthyroidism, in the past 10 mg because a TSH of 31 and a 5 mg is not enough.  I have suggested switching his methimazole from the 10 mg to the 5 mg and he will take it as below   Medications   Change methimazole to 5 mg, take 2 tabs daily Monday through Friday, 1 tablet Saturday and Sunday    2. Graves' Disease:    - No extra-thyroidal manifestations of graves' disease.       F/u in 6 months    Signed electronically by: Mack Guise, MD  [facility] Endocrinology  [facility] [address] [place], [place] [address] Phone: [phone] FAX: [phone]      CC: Pcp, No No address on file Phone: None  Fax: None   Return to Endocrinology clinic as below: No future appointments.

## 2022-06-06 LAB — T3: T3, Total: 230 ng/dL — ABNORMAL HIGH (ref 76–181)

## 2022-06-06 MED ORDER — METHIMAZOLE 5 MG PO TABS
5.0000 mg | ORAL_TABLET | Freq: Every day | ORAL | 2 refills | Status: DC
Start: 2022-06-06 — End: 2022-09-27

## 2022-08-03 ENCOUNTER — Other Ambulatory Visit: Payer: PRIVATE HEALTH INSURANCE

## 2022-08-10 ENCOUNTER — Other Ambulatory Visit (INDEPENDENT_AMBULATORY_CARE_PROVIDER_SITE_OTHER): Payer: PRIVATE HEALTH INSURANCE

## 2022-08-10 DIAGNOSIS — E0590 Thyrotoxicosis, unspecified without thyrotoxic crisis or storm: Secondary | ICD-10-CM

## 2022-08-10 LAB — TSH: TSH: 0 u[IU]/mL — ABNORMAL LOW (ref 0.35–5.50)

## 2022-08-10 LAB — T4, FREE: Free T4: 0.98 ng/dL (ref 0.60–1.60)

## 2022-08-11 LAB — T3: T3, Total: 110 ng/dL (ref 76–181)

## 2022-09-15 ENCOUNTER — Encounter: Payer: Self-pay | Admitting: Internal Medicine

## 2022-09-18 ENCOUNTER — Other Ambulatory Visit: Payer: Self-pay | Admitting: Internal Medicine

## 2022-09-18 DIAGNOSIS — E0590 Thyrotoxicosis, unspecified without thyrotoxic crisis or storm: Secondary | ICD-10-CM

## 2022-09-26 ENCOUNTER — Other Ambulatory Visit (INDEPENDENT_AMBULATORY_CARE_PROVIDER_SITE_OTHER): Payer: PRIVATE HEALTH INSURANCE

## 2022-09-26 DIAGNOSIS — E0590 Thyrotoxicosis, unspecified without thyrotoxic crisis or storm: Secondary | ICD-10-CM | POA: Diagnosis not present

## 2022-09-26 LAB — T4, FREE: Free T4: 1.26 ng/dL (ref 0.60–1.60)

## 2022-09-26 LAB — TSH: TSH: 0.02 u[IU]/mL — ABNORMAL LOW (ref 0.35–5.50)

## 2022-09-27 ENCOUNTER — Other Ambulatory Visit: Payer: Self-pay | Admitting: Internal Medicine

## 2022-09-27 ENCOUNTER — Encounter: Payer: Self-pay | Admitting: Internal Medicine

## 2022-09-27 MED ORDER — METHIMAZOLE 5 MG PO TABS: 7.5000 mg | ORAL_TABLET | Freq: Every day | ORAL | 2 refills | Status: DC

## 2022-10-03 ENCOUNTER — Other Ambulatory Visit: Payer: PRIVATE HEALTH INSURANCE

## 2022-10-20 ENCOUNTER — Encounter: Payer: Self-pay | Admitting: Internal Medicine

## 2022-10-20 ENCOUNTER — Ambulatory Visit: Payer: PRIVATE HEALTH INSURANCE | Admitting: Internal Medicine

## 2022-10-20 VITALS — BP 136/88 | HR 100 | Ht 72.0 in | Wt 220.0 lb

## 2022-10-20 DIAGNOSIS — E0590 Thyrotoxicosis, unspecified without thyrotoxic crisis or storm: Secondary | ICD-10-CM | POA: Diagnosis not present

## 2022-10-20 DIAGNOSIS — E0500 Thyrotoxicosis with diffuse goiter without thyrotoxic crisis or storm: Secondary | ICD-10-CM | POA: Diagnosis not present

## 2022-10-20 LAB — COMPREHENSIVE METABOLIC PANEL
ALT: 19 U/L (ref 0–53)
AST: 15 U/L (ref 0–37)
Albumin: 4.4 g/dL (ref 3.5–5.2)
Alkaline Phosphatase: 69 U/L (ref 39–117)
BUN: 14 mg/dL (ref 6–23)
CO2: 30 mEq/L (ref 19–32)
Calcium: 9.5 mg/dL (ref 8.4–10.5)
Chloride: 102 mEq/L (ref 96–112)
Creatinine, Ser: 1.13 mg/dL (ref 0.40–1.50)
GFR: 79.16 mL/min (ref >60.00)
Glucose, Bld: 88 mg/dL (ref 70–99)
Potassium: 4 mEq/L (ref 3.5–5.1)
Sodium: 139 mEq/L (ref 135–145)
Total Bilirubin: 1.1 mg/dL (ref 0.2–1.2)
Total Protein: 7.1 g/dL (ref 6.0–8.3)

## 2022-10-20 LAB — CBC
HCT: 47 % (ref 39.0–52.0)
Hemoglobin: 15.3 g/dL (ref 13.0–17.0)
MCHC: 32.5 g/dL (ref 30.0–36.0)
MCV: 88.9 fl (ref 78.0–100.0)
Platelets: 274 10*3/uL (ref 150.0–400.0)
RBC: 5.29 Mil/uL (ref 4.22–5.81)
RDW: 13.7 % (ref 11.5–15.5)
WBC: 9.7 10*3/uL (ref 4.0–10.5)

## 2022-10-20 LAB — T4, FREE: Free T4: 1.02 ng/dL (ref 0.60–1.60)

## 2022-10-20 LAB — TSH: TSH: 0.01 u[IU]/mL — ABNORMAL LOW (ref 0.35–5.50)

## 2022-10-20 NOTE — Progress Notes (Signed)
Name: Roberto Mason  MRN/ DOB: [id number], 12-May-1981    Age/ Sex: 42 y.o., male     PCP: Pcp, No   Reason for Endocrinology Evaluation: Hyperthyroidism     Initial Endocrinology Clinic Visit: 10/23/2018    PATIENT IDENTIFIER: [name] is a 42 y.o., male with a past medical history of GERD. He has followed with [facility] since 10/23/18 for consultative assistance with management of his Hyperthyroidism     HISTORICAL SUMMARY: The patient presented to his PCP in May, 2020 with c/o sob that started a month prior to his presentation associated with palpitations. He was found to have a suppressed TSH  At <0.01 uIU/mL . Repeat TFT's confirmed hyperthyroidism diagnosis with suppressed TSH and elevated FT4 at 3.3 ng/dL.      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 4months ago   Tobacco abuse    Past Surgical History:  Past Surgical History:  Procedure Laterality Date   FISTULA PLUG N/A 01/27/2016    Procedure: REPAIR OF PERIRECTAL FISTULA intersphincteric fistula;  Surgeon: Karie Soda, MD;  Location: [facility];  Service: General;  Laterality: N/A;   IRRIGATION AND DEBRIDEMENT ABSCESS N/A 09/22/2015   Procedure: anoscope IRRIGATION AND DEBRIDEMENT ABSCESS;  Surgeon: Claud Kelp, MD;  Location: [facility];  Service: General;  Laterality: N/A;   KNEE ARTHROSCOPY Right    MOUTH SURGERY     RECTAL EXAM UNDER ANESTHESIA N/A 01/27/2016   Procedure: RECTAL EXAM UNDER ANESTHESIA;  Surgeon: Karie Soda, MD;  Location: [facility];  Service: General;  Laterality: N/A;   Social History:  reports that he has been smoking cigarettes. He has a 20.00 pack-year smoking history. He has quit using smokeless tobacco.  His smokeless tobacco use included chew. He reports that he does not drink alcohol and does not use drugs. Family History:  Family History  Problem Relation Age of Onset   Glaucoma Mother    Heart disease Father    Hyperlipidemia Father    Hypertension Father    Heart failure Father    Hyperthyroidism Father    Diabetes Maternal Grandmother    Glaucoma Maternal Grandmother    Diabetes Maternal Grandfather    Diabetes Paternal Grandmother    Hyperthyroidism Paternal Grandmother    Diabetes Paternal Grandfather    COPD Paternal Grandfather    Lung cancer Paternal Grandfather      HOME MEDICATIONS: Allergies as of  10/20/2022       Reactions   Oxycodone Nausea Only        Medication List        Accurate as of Oct 20, 2022  7:18 AM. If you have any questions, ask your nurse or doctor.          methimazole 5 MG tablet Commonly known as: TAPAZOLE Take 1.5 tablets (7.5 mg total) by mouth daily.          OBJECTIVE:   PHYSICAL EXAM: VS: There were no vitals taken for this visit.   EXAM: General: Pt appears well and is in NAD  Eyes: External eye exam normal without stare, lid lag or exophthalmos.  EOM intact.   Neck: General: Supple without adenopathy. Thyroid: Thyroid is  prominent.  No nodules appreciated. No thyroid bruit.  Lungs: Clear with good BS bilat with no rales, rhonchi, or wheezes  Heart: Auscultation: RRR.  Abdomen: Normoactive bowel sounds, soft, nontender, without masses or organomegaly palpable  Extremities:  BL LE: No pretibial edema normal ROM and strength.  Mental Status: Judgment, insight: Intact Orientation: Oriented to time, place, and person Mood and affect: No depression, anxiety, or agitation     DATA REVIEWED:      Latest Reference Range & Units 10/20/22 09:43  Sodium 135 - 145 mEq/L 139  Potassium 3.5 - 5.1 mEq/L 4.0  Chloride 96 - 112 mEq/L 102  CO2 19 - 32 mEq/L 30  Glucose 70 - 99 mg/dL 88  BUN 6 - 23 mg/dL 14  Creatinine 1.19 - 1.47 mg/dL 8.29  Calcium 8.4 - 56.2 mg/dL 9.5  Alkaline Phosphatase 39 - 117 U/L 69  Albumin 3.5 - 5.2 g/dL 4.4  AST 0 - 37 U/L 15  ALT 0 - 53 U/L 19  Total Protein 6.0 - 8.3 g/dL 7.1  Total Bilirubin 0.2 - 1.2 mg/dL 1.1  GFR >13.08 mL/min 79.16    Latest Reference Range & Units 10/20/22 09:43  WBC 4.0 - 10.5 K/uL 9.7  RBC 4.22 - 5.81 Mil/uL 5.29  Hemoglobin 13.0 - 17.0 g/dL 65.7  HCT 84.6 - 96.2 % 47.0  MCV 78.0 - 100.0 fl 88.9  MCHC 30.0 - 36.0 g/dL 95.2  RDW 84.1 - 32.4 % 13.7  Platelets 150.0 - 400.0 K/uL 274.0  Glucose 70 - 99 mg/dL 88  TSH 4.01 - 0.27 uIU/mL 0.01 (L)  T4,Free(Direct) 0.60 - 1.60 ng/dL 2.53  (L): Data is abnormally low  ASSESSMENT / PLAN / RECOMMENDATIONS:   Hyperthyroidism Secondary to Graves' Disease:    - S/P RAI ablation  on 09/01/2020 with persistent hyperthyroid, we started methimazole 03/2021 -Patient opted not to proceed with repeat RAI or surgical intervention -I will increase his methimazole 3 weeks ago, his free T4 is trending down, TSH lags behind -No changes at this time -CBC and CMP normal   Medications   Continue methimazole 5 mg , 1.5 tabs daily     2. Graves' Disease:    - No extra-thyroidal manifestations of graves' disease.        F/u in 6 months    Signed electronically by: Lyndle Herrlich, MD  [facility] Endocrinology  [facility] [address] [place], [place] [address] Phone: [phone] FAX: [phone]      CC: Pcp, No No address on file Phone: None  Fax: None   Return to Endocrinology clinic as below: Future Appointments  Date Time Provider Department Center  10/20/2022  9:10 AM Jameel Quant,  Konrad Dolores, MD [id number] None

## 2022-10-24 LAB — T3: T3, Total: 130 ng/dL (ref 76–181)

## 2023-04-09 ENCOUNTER — Other Ambulatory Visit: Payer: Self-pay

## 2023-04-09 MED ORDER — METHIMAZOLE 5 MG PO TABS: 7.5000 mg | ORAL_TABLET | Freq: Every day | ORAL | 2 refills | Status: DC

## 2023-04-09 NOTE — Telephone Encounter (Signed)
Prescription sent

## 2023-04-25 ENCOUNTER — Encounter: Payer: Self-pay | Admitting: Internal Medicine

## 2023-04-25 ENCOUNTER — Ambulatory Visit: Payer: PRIVATE HEALTH INSURANCE | Admitting: Internal Medicine

## 2023-04-25 VITALS — BP 124/80 | HR 81 | Ht 72.0 in | Wt 224.0 lb

## 2023-04-25 DIAGNOSIS — E0500 Thyrotoxicosis with diffuse goiter without thyrotoxic crisis or storm: Secondary | ICD-10-CM

## 2023-04-25 DIAGNOSIS — E0590 Thyrotoxicosis, unspecified without thyrotoxic crisis or storm: Secondary | ICD-10-CM

## 2023-04-25 NOTE — Progress Notes (Unsigned)
Name: Roberto Mason  MRN/ DOB: [id number], [birth year]    Age/ Sex: 42 y.o., male     PCP: Pcp, No   Reason for Endocrinology Evaluation: Hyperthyroidism     Initial Endocrinology Clinic Visit: 10/23/2018    PATIENT IDENTIFIER: Roberto Mason is a 42 y.o., male with a past medical history of GERD. He has followed with [facility] since 10/23/18 for consultative assistance with management of his Hyperthyroidism     HISTORICAL SUMMARY: The patient presented to his PCP in May, 2020 with c/o sob that started a month prior to his presentation associated with palpitations. He was found to have a suppressed TSH  At <0.01 uIU/mL . Repeat TFT's confirmed hyperthyroidism diagnosis with suppressed TSH and elevated FT4 at 3.3 ng/dL.      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 is here for a follow up on his hyperthyroidism.  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   Tobacco abuse    Past Surgical History:  Past Surgical  History:  Procedure Laterality Date   FISTULA PLUG N/A 01/27/2016   Procedure: REPAIR OF PERIRECTAL FISTULA intersphincteric fistula;  Surgeon: Karie Soda, MD;  Location: [facility];  Service: General;  Laterality: N/A;   IRRIGATION AND DEBRIDEMENT ABSCESS N/A 09/22/2015   Procedure: anoscope IRRIGATION AND DEBRIDEMENT ABSCESS;  Surgeon: Claud Kelp, MD;  Location: [facility];  Service: General;  Laterality: N/A;   KNEE ARTHROSCOPY Right    MOUTH SURGERY     RECTAL EXAM UNDER ANESTHESIA N/A 01/27/2016   Procedure: RECTAL EXAM UNDER ANESTHESIA;  Surgeon: Karie Soda, MD;  Location: [facility];  Service: General;  Laterality: N/A;   Social History:  reports that he has been smoking cigarettes. He has a 20 pack-year smoking history. He has quit using smokeless tobacco.  His smokeless tobacco use included chew. He reports that he does not drink alcohol and does not use drugs. Family History:  Family History  Problem Relation Age of Onset   Glaucoma Mother    Heart disease Father    Hyperlipidemia Father    Hypertension Father    Heart failure Father    Hyperthyroidism Father    Diabetes Maternal Grandmother    Glaucoma Maternal Grandmother    Diabetes Maternal Grandfather    Diabetes Paternal Grandmother    Hyperthyroidism Paternal Grandmother    Diabetes Paternal Grandfather    COPD Paternal Grandfather    Lung cancer Paternal Grandfather  HOME MEDICATIONS: Allergies as of 04/25/2023       Reactions   Oxycodone Nausea Only        Medication List        Accurate as of April 25, 2023  9:39 AM. If you have any questions, ask your nurse or doctor.          methimazole 5 MG tablet Commonly known as: TAPAZOLE Take 1.5 tablets (7.5 mg total) by mouth daily.          OBJECTIVE:   PHYSICAL EXAM: VS: BP 124/80 (BP Location: Left Arm, Patient Position: Sitting, Cuff Size: Large)   Pulse 81   Ht 6' (1.829 m)   Wt 224 lb (101.6 kg)   SpO2 99%   BMI 30.38 kg/m     EXAM: General: Pt appears well and is in NAD  Neck: General: Supple without adenopathy. Thyroid: Thyroid is prominent.  No nodules appreciated.  Lungs: Clear with good BS bilat  Heart: Auscultation: RRR.  Abdomen:  soft, nontender  Extremities:  BL LE: No pretibial edema   Mental Status: Judgment, insight: Intact Orientation: Oriented to time, place, and person Mood and affect: No depression, anxiety, or agitation     DATA REVIEWED:    Latest Reference Range & Units 04/25/23 10:01  TSH 0.40 - 4.50 mIU/L 0.01 (L)  Triiodothyronine,Free,Serum 2.3 - 4.2 pg/mL 4.7 (H)  T4,Free(Direct) 0.8 - 1.8 ng/dL 1.8  (L): Data is abnormally low (H): Data is abnormally high   Latest Reference Range & Units 10/20/22 09:43  Sodium 135 - 145 mEq/L 139  Potassium 3.5 - 5.1 mEq/L 4.0  Chloride 96 - 112 mEq/L 102  CO2 19 - 32 mEq/L 30  Glucose 70 - 99 mg/dL 88  BUN 6 - 23 mg/dL 14  Creatinine 8.29 - 5.62 mg/dL 1.30  Calcium 8.4 - 86.5 mg/dL 9.5  Alkaline Phosphatase 39 - 117 U/L 69  Albumin 3.5 - 5.2 g/dL 4.4  AST 0 - 37 U/L 15  ALT 0 - 53 U/L 19  Total Protein 6.0 - 8.3 g/dL 7.1  Total Bilirubin 0.2 - 1.2 mg/dL 1.1  GFR >78.46 mL/min 79.16    Latest Reference Range & Units 10/20/22 09:43  WBC 4.0 - 10.5 K/uL 9.7  RBC 4.22 - 5.81 Mil/uL 5.29  Hemoglobin 13.0 - 17.0 g/dL 96.2  HCT 95.2 - 84.1 % 47.0  MCV 78.0 - 100.0 fl 88.9  MCHC 30.0 - 36.0 g/dL 32.4  RDW 40.1 - 02.7 % 13.7  Platelets 150.0 - 400.0 K/uL 274.0  Glucose 70 - 99 mg/dL 88  TSH 2.53 - 6.64 uIU/mL 0.01 (L)  T4,Free(Direct) 0.60 - 1.60 ng/dL 4.03  (L): Data is abnormally low  ASSESSMENT / PLAN / RECOMMENDATIONS:   Hyperthyroidism Secondary to Graves' Disease:    - S/P RAI ablation  on 09/01/2020 with persistent hyperthyroid, we started methimazole 03/2021 -Patient opted not to proceed with repeat RAI or surgical intervention -I will increase his methimazole as below due to suppressed TSH and elevated free T3 -  Will repeat labs in 2 months  Medications   Change  methimazole 5 mg , 2 tabs Friday, Saturday and Sunday and 1 tab rest of the week    2. Graves' Disease:    - No extra-thyroidal manifestations of graves' disease.       F/u in 6 months    Signed electronically by: Lyndle Herrlich, MD  [facility] Endocrinology  [facility] [address] [place] [place]  [address] [place], [place] [address] Phone: [phone] FAX: [phone]      CC: Pcp, No No address on file Phone: None  Fax: None   Return to Endocrinology clinic as below: No future appointments.

## 2023-04-26 ENCOUNTER — Telehealth: Payer: Self-pay | Admitting: Internal Medicine

## 2023-04-26 LAB — T4, FREE: Free T4: 1.8 ng/dL (ref 0.8–1.8)

## 2023-04-26 LAB — T3, FREE: T3, Free: 4.7 pg/mL — ABNORMAL HIGH (ref 2.3–4.2)

## 2023-04-26 LAB — TSH: TSH: 0.01 m[IU]/L — ABNORMAL LOW (ref 0.40–4.50)

## 2023-04-26 MED ORDER — METHIMAZOLE 5 MG PO TABS: 5.0000 mg | ORAL_TABLET | ORAL | 2 refills | Status: DC

## 2023-04-26 NOTE — Telephone Encounter (Signed)
Please contact the patient and schedule him for a lab appointment in 2 months   Thanks

## 2023-06-27 ENCOUNTER — Other Ambulatory Visit: Payer: PRIVATE HEALTH INSURANCE

## 2023-06-28 ENCOUNTER — Encounter: Payer: Self-pay | Admitting: Internal Medicine

## 2023-06-28 ENCOUNTER — Other Ambulatory Visit: Payer: Self-pay | Admitting: Internal Medicine

## 2023-06-28 LAB — T3, FREE: T3, Free: 4.3 pg/mL — ABNORMAL HIGH (ref 2.3–4.2)

## 2023-06-28 LAB — TSH: TSH: 0.01 m[IU]/L — ABNORMAL LOW (ref 0.40–4.50)

## 2023-06-28 LAB — T4, FREE: Free T4: 1.5 ng/dL (ref 0.8–1.8)

## 2023-06-28 MED ORDER — METHIMAZOLE 5 MG PO TABS: 10.0000 mg | ORAL_TABLET | Freq: Every day | ORAL | 2 refills | Status: DC

## 2023-10-29 ENCOUNTER — Ambulatory Visit (INDEPENDENT_AMBULATORY_CARE_PROVIDER_SITE_OTHER): Payer: PRIVATE HEALTH INSURANCE | Admitting: Internal Medicine

## 2023-10-29 ENCOUNTER — Encounter: Payer: Self-pay | Admitting: Internal Medicine

## 2023-10-29 VITALS — BP 138/82 | HR 80 | Ht 72.0 in | Wt 228.0 lb

## 2023-10-29 DIAGNOSIS — E0500 Thyrotoxicosis with diffuse goiter without thyrotoxic crisis or storm: Secondary | ICD-10-CM | POA: Diagnosis not present

## 2023-10-29 DIAGNOSIS — E0590 Thyrotoxicosis, unspecified without thyrotoxic crisis or storm: Secondary | ICD-10-CM | POA: Diagnosis not present

## 2023-10-29 NOTE — Progress Notes (Unsigned)
 Name: Roberto Mason  MRN/ DOB: [id number], [birth year]    Age/ Sex: 43 y.o., male     PCP: Pcp, No   Reason for Endocrinology Evaluation: Hyperthyroidism     Initial Endocrinology Clinic Visit: 10/23/2018    PATIENT IDENTIFIER: [name] is a 43 y.o., male with a past medical history of GERD. He has followed with [facility] since 10/23/18 for consultative assistance with management of his Hyperthyroidism     HISTORICAL SUMMARY: The patient presented to his PCP in May, 2020 with c/o sob that started a month prior to his presentation associated with palpitations. He was found to have a suppressed TSH  At <0.01 uIU/mL . Repeat TFT's confirmed hyperthyroidism diagnosis with suppressed TSH and elevated FT4 at 3.3 ng/dL.      He was started on Methimazole  15 mg daily in May, 2020  Thyroid  uptake and scan 07/2020 showed a 24 hour I-123 uptake = 59.4% (normal 10-30%)  S/P RAI ablation on 09/01/2020 with 12.7 mCi I-131 sodium iodide orally  Restarted Methimazole  03/2021 due to persistent hyperthyroidism  Father and paternal grandmother with hyperthyroidism   SUBJECTIVE:    Today (10/29/2023):  [name] is here for a follow up on his hyperthyroidism.  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   Tobacco abuse    Past Surgical History:  Past Surgical History:  Procedure  Laterality Date   FISTULA PLUG N/A 01/27/2016   Procedure: REPAIR OF PERIRECTAL FISTULA intersphincteric fistula;  Surgeon: Candyce Champagne, MD;  Location: [facility];  Service: General;  Laterality: N/A;   IRRIGATION AND DEBRIDEMENT ABSCESS N/A 09/22/2015   Procedure: anoscope IRRIGATION AND DEBRIDEMENT ABSCESS;  Surgeon: Boyce Byes, MD;  Location: [facility];  Service: General;  Laterality: N/A;   KNEE ARTHROSCOPY Right    MOUTH SURGERY     RECTAL EXAM UNDER ANESTHESIA N/A 01/27/2016   Procedure: RECTAL EXAM UNDER ANESTHESIA;  Surgeon: Candyce Champagne, MD;  Location: [facility];  Service: General;  Laterality: N/A;   Social History:  reports that he has been smoking cigarettes. He has a 20 pack-year smoking history. He has quit using smokeless tobacco.  His smokeless tobacco use included chew. He reports that he does not drink alcohol and does not use drugs. Family History:  Family History  Problem Relation Age of Onset   Glaucoma Mother    Heart disease Father    Hyperlipidemia Father    Hypertension Father    Heart failure Father    Hyperthyroidism Father    Diabetes Maternal Grandmother    Glaucoma Maternal Grandmother    Diabetes Maternal Grandfather    Diabetes Paternal Grandmother    Hyperthyroidism Paternal Grandmother    Diabetes Paternal Grandfather    COPD Paternal Grandfather    Lung cancer Paternal Grandfather  HOME MEDICATIONS: Allergies as of 10/29/2023       Reactions   Oxycodone  Nausea Only        Medication List        Accurate as of October 29, 2023  9:58 AM. If you have any questions, ask your nurse or doctor.          methimazole  5 MG tablet Commonly known as: TAPAZOLE  Take 2 tablets (10 mg total) by mouth daily. 2 tabs Friday, Saturday and Sunday, 1.5 tab rest of the week          OBJECTIVE:   PHYSICAL EXAM: VS: BP 138/82 (BP Location: Left Arm, Patient Position: Sitting, Cuff Size: Normal)   Pulse 80   Ht 6' (1.829 m)   Wt 228 lb (103.4 kg)   SpO2  97%   BMI 30.92 kg/m    EXAM: General: Pt appears well and is in NAD  Neck: General: Supple without adenopathy. Thyroid : Thyroid  is prominent.  No nodules appreciated.  Lungs: Clear with good BS bilat  Heart: Auscultation: RRR.  Abdomen:  soft, nontender  Extremities:  BL LE: No pretibial edema   Mental Status: Judgment, insight: Intact Orientation: Oriented to time, place, and person Mood and affect: No depression, anxiety, or agitation     DATA REVIEWED:    Latest Reference Range & Units 10/29/23 10:19  TSH 0.40 - 4.50 mIU/L 2.86  Triiodothyronine,Free,Serum 2.3 - 4.2 pg/mL 2.9  T4,Free(Direct) 0.8 - 1.8 ng/dL 1.0       Latest Reference Range & Units 10/20/22 09:43  Sodium 135 - 145 mEq/L 139  Potassium 3.5 - 5.1 mEq/L 4.0  Chloride 96 - 112 mEq/L 102  CO2 19 - 32 mEq/L 30  Glucose 70 - 99 mg/dL 88  BUN 6 - 23 mg/dL 14  Creatinine 1.61 - 0.96 mg/dL 0.45  Calcium 8.4 - 40.9 mg/dL 9.5  Alkaline Phosphatase 39 - 117 U/L 69  Albumin 3.5 - 5.2 g/dL 4.4  AST 0 - 37 U/L 15  ALT 0 - 53 U/L 19  Total Protein 6.0 - 8.3 g/dL 7.1  Total Bilirubin 0.2 - 1.2 mg/dL 1.1  GFR >81.19 mL/min 79.16    Latest Reference Range & Units 10/20/22 09:43  WBC 4.0 - 10.5 K/uL 9.7  RBC 4.22 - 5.81 Mil/uL 5.29  Hemoglobin 13.0 - 17.0 g/dL 14.7  HCT 82.9 - 56.2 % 47.0  MCV 78.0 - 100.0 fl 88.9  MCHC 30.0 - 36.0 g/dL 13.0  RDW 86.5 - 78.4 % 13.7  Platelets 150.0 - 400.0 K/uL 274.0  Glucose 70 - 99 mg/dL 88  TSH 6.96 - 2.95 uIU/mL 0.01 (L)  T4,Free(Direct) 0.60 - 1.60 ng/dL 2.84  (L): Data is abnormally low  ASSESSMENT / PLAN / RECOMMENDATIONS:   Hyperthyroidism Secondary to Graves' Disease:    - S/P RAI ablation  on 09/01/2020 with persistent hyperthyroid, we started methimazole  03/2021 -Patient opted not to proceed with repeat RAI or surgical intervention - TFTs are within normal range, no change   Medications   Continue methimazole  10 mg , daily    2. Graves' Disease:     - No extra-thyroidal manifestations of graves' disease.       F/u in 6 months    Signed electronically by: Natale Bail, MD  [facility] Endocrinology  [facility] [address] [place], [place] [address] Phone: [phone] FAX: [phone]      CC: Pcp, No No address on file Phone: None  Fax: None   Return to Endocrinology clinic as below: No future appointments.

## 2023-10-30 ENCOUNTER — Ambulatory Visit: Payer: Self-pay | Admitting: Internal Medicine

## 2023-10-30 LAB — T4, FREE: Free T4: 1 ng/dL (ref 0.8–1.8)

## 2023-10-30 LAB — TSH: TSH: 2.86 m[IU]/L (ref 0.40–4.50)

## 2023-10-30 LAB — T3, FREE: T3, Free: 2.9 pg/mL (ref 2.3–4.2)

## 2023-10-30 MED ORDER — METHIMAZOLE 5 MG PO TABS: 10.0000 mg | ORAL_TABLET | Freq: Every day | ORAL | 2 refills | Status: AC

## 2024-04-28 NOTE — Progress Notes (Unsigned)
 Name: Roberto Mason  MRN/ DOB: [id number], [birth year]    Age/ Sex: 43 y.o., male     PCP: Pcp, No   Reason for Endocrinology Evaluation: Hyperthyroidism     Initial Endocrinology Clinic Visit: 10/23/2018    PATIENT IDENTIFIER: [name] is a 43 y.o., male with a past medical history of GERD. He has followed with [facility] since 10/23/18 for consultative assistance with management of his Hyperthyroidism     HISTORICAL SUMMARY: The patient presented to his PCP in May, 2020 with c/o sob that started a month prior to his presentation associated with palpitations. He was found to have a suppressed TSH  At <0.01 uIU/mL . Repeat TFT's confirmed hyperthyroidism diagnosis with suppressed TSH and elevated FT4 at 3.3 ng/dL.      He was started on Methimazole  15 mg daily in May, 2020  Thyroid  uptake and scan 07/2020 showed a 24 hour I-123 uptake = 59.4% (normal 10-30%)  S/P RAI ablation on 09/01/2020 with 12.7 mCi I-131 sodium iodide orally  Restarted Methimazole  03/2021 due to persistent hyperthyroidism  Father and paternal grandmother with hyperthyroidism   SUBJECTIVE:    Today (04/29/2024):  Roberto Mason is here for a follow up on his hyperthyroidism.  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   Tobacco abuse    Past Surgical History:  Past Surgical History:  Procedure  Laterality Date   FISTULA PLUG N/A 01/27/2016   Procedure: REPAIR OF PERIRECTAL FISTULA intersphincteric fistula;  Surgeon: Elspeth Schultze, MD;  Location: [facility];  Service: General;  Laterality: N/A;   IRRIGATION AND DEBRIDEMENT ABSCESS N/A 09/22/2015   Procedure: anoscope IRRIGATION AND DEBRIDEMENT ABSCESS;  Surgeon: Elon Pacini, MD;  Location: [facility];  Service: General;  Laterality: N/A;   KNEE ARTHROSCOPY Right    MOUTH SURGERY     RECTAL EXAM UNDER ANESTHESIA N/A 01/27/2016   Procedure: RECTAL EXAM UNDER ANESTHESIA;  Surgeon: Elspeth Schultze, MD;  Location: [facility];  Service: General;  Laterality: N/A;   Social History:  reports that he has been smoking cigarettes. He has a 20 pack-year smoking history. He has quit using smokeless tobacco.  His smokeless tobacco use included chew. He reports that he does not drink alcohol and does not use drugs. Family History:  Family History  Problem Relation Age of Onset   Glaucoma Mother    Heart disease Father    Hyperlipidemia Father    Hypertension Father    Heart failure Father    Hyperthyroidism Father    Diabetes Maternal Grandmother    Glaucoma Maternal Grandmother    Diabetes Maternal Grandfather    Diabetes Paternal Grandmother    Hyperthyroidism Paternal Grandmother    Diabetes Paternal Grandfather    COPD Paternal Grandfather    Lung cancer Paternal Grandfather  HOME MEDICATIONS: Allergies as of 04/29/2024       Reactions   Oxycodone  Nausea Only        Medication List        Accurate as of April 29, 2024 12:47 PM. If you have any questions, ask your nurse or doctor.          methimazole  5 MG tablet Commonly known as: TAPAZOLE  Take 2 tablets (10 mg total) by mouth daily.          OBJECTIVE:   PHYSICAL EXAM: VS: BP (!) 156/88   Pulse 98   Ht 6' (1.829 m)   Wt 237 lb (107.5 kg)   SpO2 98%   BMI 32.14 kg/m    EXAM: General: Pt appears well and is in NAD  Neck: General: Supple without  adenopathy. Thyroid : Thyroid  is prominent.  No nodules appreciated.  Lungs: Clear with good BS bilat  Heart: Auscultation: RRR.  Abdomen:  soft, nontender  Extremities:  BL LE: No pretibial edema   Mental Status: Judgment, insight: Intact Orientation: Oriented to time, place, and person Mood and affect: No depression, anxiety, or agitation     DATA REVIEWED:    Latest Reference Range & Units 10/29/23 10:19  TSH 0.40 - 4.50 mIU/L 2.86  Triiodothyronine,Free,Serum 2.3 - 4.2 pg/mL 2.9  T4,Free(Direct) 0.8 - 1.8 ng/dL 1.0       Latest Reference Range & Units 10/20/22 09:43  Sodium 135 - 145 mEq/L 139  Potassium 3.5 - 5.1 mEq/L 4.0  Chloride 96 - 112 mEq/L 102  CO2 19 - 32 mEq/L 30  Glucose 70 - 99 mg/dL 88  BUN 6 - 23 mg/dL 14  Creatinine 9.59 - 8.49 mg/dL 8.86  Calcium 8.4 - 89.4 mg/dL 9.5  Alkaline Phosphatase 39 - 117 U/L 69  Albumin 3.5 - 5.2 g/dL 4.4  AST 0 - 37 U/L 15  ALT 0 - 53 U/L 19  Total Protein 6.0 - 8.3 g/dL 7.1  Total Bilirubin 0.2 - 1.2 mg/dL 1.1  GFR >39.99 mL/min 79.16    Latest Reference Range & Units 10/20/22 09:43  WBC 4.0 - 10.5 K/uL 9.7  RBC 4.22 - 5.81 Mil/uL 5.29  Hemoglobin 13.0 - 17.0 g/dL 84.6  HCT 60.9 - 47.9 % 47.0  MCV 78.0 - 100.0 fl 88.9  MCHC 30.0 - 36.0 g/dL 67.4  RDW 88.4 - 84.4 % 13.7  Platelets 150.0 - 400.0 K/uL 274.0  Glucose 70 - 99 mg/dL 88  TSH 9.64 - 4.49 uIU/mL 0.01 (L)  T4,Free(Direct) 0.60 - 1.60 ng/dL 8.97  (L): Data is abnormally low  ASSESSMENT / PLAN / RECOMMENDATIONS:   Hyperthyroidism Secondary to Graves' Disease:    - S/P RAI ablation  on 09/01/2020 with persistent hyperthyroid, we started methimazole  03/2021 -Patient opted not to proceed with repeat RAI or surgical intervention - TFTs are within normal range, no change   Medications   Continue methimazole  10 mg , daily    2. Graves' Disease:    - No extra-thyroidal manifestations of graves' disease.       F/u in 6 months    Signed  electronically by: Stefano Redgie Butts, MD  [facility] Endocrinology  [facility] [address] [place], [place] [address] Phone: [phone] FAX: [phone]      CC: Pcp, No No address on file Phone: None  Fax: None   Return to Endocrinology clinic as below: No future appointments.

## 2024-04-29 ENCOUNTER — Ambulatory Visit: Payer: Self-pay | Admitting: Internal Medicine

## 2024-04-29 ENCOUNTER — Encounter: Payer: Self-pay | Admitting: Internal Medicine

## 2024-04-29 ENCOUNTER — Other Ambulatory Visit

## 2024-04-29 VITALS — BP 130/82 | HR 98 | Ht 72.0 in | Wt 237.0 lb

## 2024-04-29 DIAGNOSIS — E0590 Thyrotoxicosis, unspecified without thyrotoxic crisis or storm: Secondary | ICD-10-CM

## 2024-04-29 DIAGNOSIS — E0500 Thyrotoxicosis with diffuse goiter without thyrotoxic crisis or storm: Secondary | ICD-10-CM

## 2024-04-30 ENCOUNTER — Ambulatory Visit: Payer: Self-pay | Admitting: Internal Medicine

## 2024-04-30 LAB — TSH: TSH: 2.52 m[IU]/L (ref 0.40–4.50)

## 2024-04-30 LAB — T4, FREE: Free T4: 1.4 ng/dL (ref 0.8–1.8)

## 2024-07-01 ENCOUNTER — Other Ambulatory Visit

## 2024-09-03 ENCOUNTER — Ambulatory Visit: Admitting: Internal Medicine
